# Patient Record
Sex: Female | Born: 1975 | Race: White | Hispanic: No | Marital: Single | Smoking: Never smoker
Health system: Southern US, Community
[De-identification: ages and names within clinical notes are randomized; demographics above are authoritative.]

## PROBLEM LIST (undated history)

## (undated) DIAGNOSIS — Z8049 Family history of malignant neoplasm of other genital organs: Secondary | ICD-10-CM

## (undated) DIAGNOSIS — M545 Low back pain: Principal | ICD-10-CM

## (undated) DIAGNOSIS — T7840XA Allergy, unspecified, initial encounter: Secondary | ICD-10-CM

## (undated) DIAGNOSIS — Z8041 Family history of malignant neoplasm of ovary: Secondary | ICD-10-CM

## (undated) DIAGNOSIS — Z800 Family history of malignant neoplasm of digestive organs: Secondary | ICD-10-CM

## (undated) DIAGNOSIS — Z801 Family history of malignant neoplasm of trachea, bronchus and lung: Secondary | ICD-10-CM

## (undated) DIAGNOSIS — Z8042 Family history of malignant neoplasm of prostate: Secondary | ICD-10-CM

## (undated) DIAGNOSIS — Z8052 Family history of malignant neoplasm of bladder: Secondary | ICD-10-CM

## (undated) DIAGNOSIS — B009 Herpesviral infection, unspecified: Secondary | ICD-10-CM

## (undated) DIAGNOSIS — G8929 Other chronic pain: Secondary | ICD-10-CM

## (undated) HISTORY — DX: Family history of malignant neoplasm of digestive organs: Z80.0

## (undated) HISTORY — DX: Allergy, unspecified, initial encounter: T78.40XA

## (undated) HISTORY — DX: Family history of malignant neoplasm of other genital organs: Z80.49

## (undated) HISTORY — DX: Other chronic pain: G89.29

## (undated) HISTORY — DX: Family history of malignant neoplasm of ovary: Z80.41

## (undated) HISTORY — PX: TONSILLECTOMY: SUR1361

## (undated) HISTORY — DX: Family history of malignant neoplasm of bladder: Z80.52

## (undated) HISTORY — DX: Low back pain: M54.5

## (undated) HISTORY — PX: BUNIONECTOMY: SHX129

## (undated) HISTORY — DX: Family history of malignant neoplasm of trachea, bronchus and lung: Z80.1

## (undated) HISTORY — DX: Herpesviral infection, unspecified: B00.9

## (undated) HISTORY — DX: Family history of malignant neoplasm of prostate: Z80.42

---

## 2016-11-23 DIAGNOSIS — R7301 Impaired fasting glucose: Secondary | ICD-10-CM | POA: Insufficient documentation

## 2017-08-08 ENCOUNTER — Other Ambulatory Visit: Payer: Self-pay

## 2017-08-08 ENCOUNTER — Emergency Department (INDEPENDENT_AMBULATORY_CARE_PROVIDER_SITE_OTHER)
Admission: EM | Admit: 2017-08-08 | Discharge: 2017-08-08 | Disposition: A | Discharge status: Home / Self Care | Payer: Managed Care, Other (non HMO) | Source: Home / Self Care | Attending: Emergency Medicine | Admitting: Emergency Medicine

## 2017-08-08 ENCOUNTER — Encounter: Payer: Self-pay | Admitting: Emergency Medicine

## 2017-08-08 DIAGNOSIS — W57XXXA Bitten or stung by nonvenomous insect and other nonvenomous arthropods, initial encounter: Secondary | ICD-10-CM

## 2017-08-08 DIAGNOSIS — L2389 Allergic contact dermatitis due to other agents: Secondary | ICD-10-CM

## 2017-08-08 MED ORDER — BETAMETHASONE DIPROPIONATE 0.05 % EX CREA: TOPICAL_CREAM | Freq: Two times a day (BID) | CUTANEOUS | 0 refills | Status: DC

## 2017-08-08 MED ORDER — PREDNISONE 20 MG PO TABS: 20.0000 mg | ORAL_TABLET | Freq: Two times a day (BID) | ORAL | 0 refills | Status: DC

## 2017-08-08 MED ORDER — HYDROXYZINE HCL 10 MG PO TABS: ORAL_TABLET | ORAL | 0 refills | Status: DC

## 2017-08-08 NOTE — ED Triage Notes (Signed)
Pt c/o bilateral leg redness and rash.

## 2017-08-09 NOTE — ED Provider Notes (Signed)
[facility]    CSN: [id number] Arrival date & time: 08/08/17  1708     History   Chief Complaint Chief Complaint  Patient presents with  . Rash    HPI Morgan Lucas is a 42 y.o. female.   HPI Complains of severe worsening pruritic rash from bug bites on both legs, for about 4 to 5 days. Has been trying over-the-counter hydrocortisone cream without help.  Oral Benadryl not helping. Denies fever or chills or nausea or vomiting. No cardiorespiratory symptoms.  No ENT symptoms.  No dysphasia or hoarseness.  No breathing problems or wheezing. History reviewed. No pertinent past medical history. Past medical history negative for chronic disease.  Denies history of diabetes.  She denies chance of pregnancy. There are no active problems to display for this patient.   Past Surgical History:  Procedure Laterality Date  . BUNIONECTOMY    . TONSILLECTOMY      OB History   None      Home Medications    Prior to Admission medications   Medication Sig Start Date End Date Taking? Authorizing Provider  acyclovir (ZOVIRAX) 400 MG tablet Take 400 mg by mouth 5 (five) times daily.   Yes [provider]  levonorgestrel-ethinyl estradiol (AVIANE,ALESSE,LESSINA) 0.1-20 MG-MCG tablet Take 1 tablet by mouth daily.   Yes [provider]  Lysine 500 MG CAPS Take by mouth.   Yes [provider]  betamethasone dipropionate (DIPROLENE) 0.05 % cream Apply topically 2 (two) times daily. 08/08/17   Lajean ManesMassey, David, MD  hydrOXYzine (ATARAX/VISTARIL) 10 MG tablet 1 or 2 tabs every 6 hours as needed for itch . caution: May cause drowsiness 08/08/17   Lajean ManesMassey, David, MD  predniSONE (DELTASONE) 20 MG tablet Take 1 tablet (20 mg total) by mouth 2 (two) times daily with a meal. X 5 days 08/08/17   Lajean ManesMassey, David, MD    Family History Family History  Problem Relation Age of Onset  . Hypertension Mother   . Hyperlipidemia Mother   . Cancer Father   . Diabetes Father     . Hypertension Father   . Hyperlipidemia Father     Social History Social History   Tobacco Use  . Smoking status: Never Smoker  . Smokeless tobacco: Never Used  Substance Use Topics  . Alcohol use: Yes  . Drug use: Not Currently  Does not smoke or use drugs.  Only drinks alcohol rarely.   Allergies   Codeine; Nickel; Pistachio nut (diagnostic); and Silver   Review of Systems Review of Systems  All other systems reviewed and are negative.  Pertinent items noted in HPI and remainder of comprehensive ROS otherwise negative.  Physical Exam Triage Vital Signs ED Triage Vitals  Enc Vitals Group     BP 08/08/17 1726 127/76     Pulse Rate 08/08/17 1726 87     Resp --      Temp 08/08/17 1726 98.4 F (36.9 C)     Temp Source 08/08/17 1726 Oral     SpO2 08/08/17 1726 99 %     Weight 08/08/17 1727 174 lb (78.9 kg)     Height 08/08/17 1727 5\' 3"  (1.6 m)     Head Circumference --      Peak Flow --      Pain Score 08/08/17 1727 0     Pain Loc --      Pain Edu? --      Excl. in GC? --    No data  found.  Updated Vital Signs BP 127/76 (BP Location: Left Arm)   Pulse 87   Temp 98.4 F (36.9 C) (Oral)   Ht 5\' 3"  (1.6 m)   Wt 174 lb (78.9 kg)   LMP 07/22/2017 (Approximate)   SpO2 99%   BMI 30.82 kg/m   Visual Acuity Right Eye Distance:   Left Eye Distance:   Bilateral Distance:    Right Eye Near:   Left Eye Near:    Bilateral Near:     Physical Exam  Constitutional: She is oriented to person, place, and time. She appears well-developed and well-nourished. No distress.  No cardiorespiratory distress, but she is extremely uncomfortable from itchy rash both legs.  HENT:  Head: Normocephalic and atraumatic.  Mouth/Throat: Oropharynx is clear and moist and mucous membranes are normal. No posterior oropharyngeal edema or posterior oropharyngeal erythema.  Eyes: Pupils are equal, round, and reactive to light. No scleral icterus.  Neck: Normal range of motion. Neck  supple.  Cardiovascular: Normal rate and regular rhythm.  Pulmonary/Chest: Effort normal and breath sounds normal. She has no wheezes.  Abdominal: She exhibits no distension.  Neurological: She is alert and oriented to person, place, and time. No cranial nerve deficit.  Skin: Skin is warm and dry. Capillary refill takes less than 2 seconds. Rash noted.  Psychiatric: She has a normal mood and affect. Her behavior is normal.  Vitals reviewed. Both lower legs diffusely have multiple papules that are red, inflamed and raised.  No pustules or vesicles.  No annular lesions.  No sign of secondary infection.  No track marks. There is no rash on the feet or ankles or hands or wrists or trunk. Feet: Capillary refill and DP pulse and neurovascular distally intact.  UC Treatments / Results  Labs (all labs ordered are listed, but only abnormal results are displayed) Labs Reviewed - No data to display  EKG None  Radiology No results found.  Procedures Procedures (including critical care time)  Medications Ordered in UC Medications - No data to display  Initial Impression / Assessment and Plan / UC Course  I have reviewed the triage vital signs and the nursing notes.  Pertinent labs & imaging results that were available during my care of the patient were reviewed by me and considered in my medical decision making (see chart for details).      Final Clinical Impressions(s) / UC Diagnoses   Final diagnoses:  Allergic contact dermatitis due to other agents  Multiple insect bites   Clinically, diagnosis is severe multiple insect bites, bilateral lower legs.  Rash is not typical of scabies.  No evidence of secondary bacterial infection. Treatment options discussed, as well as risks, benefits, alternatives. Patient voiced understanding and agreement with the following plans: Symptomatic nonpharmacologic measures discussed. Some written information given.  Questions invited and  answered.  ED Prescriptions    Medication Sig Dispense Auth. Provider   hydrOXYzine (ATARAX/VISTARIL) 10 MG tablet 1 or 2 tabs every 6 hours as needed for itch . caution: May cause drowsiness 20 tablet Lajean Manes, MD   betamethasone dipropionate (DIPROLENE) 0.05 % cream Apply topically 2 (two) times daily. 45 g Lajean Manes, MD   predniSONE (DELTASONE) 20 MG tablet Take 1 tablet (20 mg total) by mouth 2 (two) times daily with a meal. X 5 days 10 tablet Lajean Manes, MD     Follow-up with your primary care doctor or dermatologist in 5-7 days if not improving, or sooner if symptoms become worse. Precautions  discussed. Red flags discussed. Questions invited and answered. Patient voiced understanding and agreement.  Controlled Substance Prescriptions [place] Controlled Substance Registry consulted? Not Applicable   Lajean Manes, MD 08/09/17 2244

## 2017-08-30 ENCOUNTER — Ambulatory Visit (INDEPENDENT_AMBULATORY_CARE_PROVIDER_SITE_OTHER): Payer: Managed Care, Other (non HMO) | Admitting: Osteopathic Medicine

## 2017-08-30 ENCOUNTER — Encounter: Payer: Self-pay | Admitting: Osteopathic Medicine

## 2017-08-30 VITALS — BP 121/69 | HR 66 | Temp 98.5°F | Wt 176.2 lb

## 2017-08-30 DIAGNOSIS — M545 Low back pain, unspecified: Secondary | ICD-10-CM | POA: Insufficient documentation

## 2017-08-30 DIAGNOSIS — Z8041 Family history of malignant neoplasm of ovary: Secondary | ICD-10-CM

## 2017-08-30 DIAGNOSIS — G8929 Other chronic pain: Secondary | ICD-10-CM

## 2017-08-30 DIAGNOSIS — B009 Herpesviral infection, unspecified: Secondary | ICD-10-CM | POA: Diagnosis not present

## 2017-08-30 DIAGNOSIS — Z23 Encounter for immunization: Secondary | ICD-10-CM | POA: Diagnosis not present

## 2017-08-30 DIAGNOSIS — R21 Rash and other nonspecific skin eruption: Secondary | ICD-10-CM | POA: Insufficient documentation

## 2017-08-30 HISTORY — DX: Herpesviral infection, unspecified: B00.9

## 2017-08-30 HISTORY — DX: Family history of malignant neoplasm of ovary: Z80.41

## 2017-08-30 HISTORY — DX: Low back pain, unspecified: M54.50

## 2017-08-30 HISTORY — DX: Other chronic pain: G89.29
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
## 2017-09-10 ENCOUNTER — Ambulatory Visit: Payer: Managed Care, Other (non HMO) | Admitting: Sports Medicine

## 2017-09-11 ENCOUNTER — Encounter: Payer: Self-pay | Admitting: Emergency Medicine

## 2017-09-11 ENCOUNTER — Emergency Department (INDEPENDENT_AMBULATORY_CARE_PROVIDER_SITE_OTHER)
Admission: EM | Admit: 2017-09-11 | Discharge: 2017-09-11 | Disposition: A | Discharge status: Home / Self Care | Payer: Managed Care, Other (non HMO) | Source: Home / Self Care | Attending: Family Medicine | Admitting: Family Medicine

## 2017-09-11 ENCOUNTER — Other Ambulatory Visit: Payer: Self-pay

## 2017-09-11 DIAGNOSIS — M545 Low back pain, unspecified: Secondary | ICD-10-CM

## 2017-09-11 MED ORDER — PREDNISONE 20 MG PO TABS: ORAL_TABLET | ORAL | 0 refills | Status: DC

## 2017-09-11 MED ORDER — CYCLOBENZAPRINE HCL 5 MG PO TABS: 5.0000 mg | ORAL_TABLET | Freq: Two times a day (BID) | ORAL | 0 refills | Status: AC | PRN

## 2017-09-11 NOTE — Discharge Instructions (Signed)
°  Flexeril (cyclobenzaprine) is a muscle relaxer and may cause drowsiness. Do not drink alcohol, drive, or operate heavy machinery while taking.  Please follow up with your medical provider on Tuesday as previously scheduled for continued management of your lower back pain.

## 2017-09-11 NOTE — ED Triage Notes (Signed)
Pt c/o of back pain. States she has chronic issues with back and was given back stregthening exercises to perform and while doing those yesterday morning she felt her back give out. States she had an appointment with a DO but they were unable to adjust her until Tuesday.

## 2017-09-11 NOTE — ED Provider Notes (Signed)
[facility]    CSN: [id number] Arrival date & time: 09/11/17  1243     History   Chief Complaint Chief Complaint  Patient presents with  . Back Pain    HPI Morgan Lucas is a 42 y.o. female.   HPI  Morgan Lucas is a 42 y.o. female presenting to UC with c/o exacerbation of chronic low back pain after doing some stretching exercises at home yesterday. She felt a "pop" in her back and her back "gave out" she has had muscle spasms and sharp pain since then. Pain is moderate but severe at times. Pain gradually improves with slow movement but is most severe when she initially gets out of bed or out of a chair. Denies numbness or weakness in arms or legs. No change in bowel or bladder habits. Pt notes she usually has to get her back "adjusted" every few months. She has f/u with her new PCP on Tuesday but did not feel like she could wait until then. She has done well with pain medication and muscle relaxers before.    Past Medical History:  Diagnosis Date  . Chronic bilateral low back pain without sciatica 08/30/2017  . Family history of ovarian cancer 08/30/2017  . 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 Rash and nonspecific skin eruption 08/30/2017  . 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 Family history of ovarian cancer 08/30/2017  . Chronic bilateral low back pain without sciatica 08/30/2017    Past Surgical History:  Procedure Laterality Date  . BUNIONECTOMY    . TONSILLECTOMY      OB History   None      Home Medications    Prior to Admission medications   Medication Sig Start Date End Date Taking? Authorizing Provider  acyclovir (ZOVIRAX) 400 MG tablet Take 400 mg by mouth 5 (five) times daily.    [provider]  cetirizine (ZYRTEC) 10 MG tablet Take 10 mg by mouth daily.    [provider]  cyclobenzaprine (FLEXERIL) 5 MG tablet Take 1-2 tablets (5-10 mg total) by mouth 2 (two) times daily as needed  for muscle spasms. 09/11/17   Lurene Shadow, PA-C  EPINEPHrine 0.15 MG/0.15ML IJ injection Inject 0.15 mg into the muscle as needed for anaphylaxis.    [provider]  levonorgestrel-ethinyl estradiol (AVIANE,ALESSE,LESSINA) 0.1-20 MG-MCG tablet Take 1 tablet by mouth daily.    [provider]  Lysine 500 MG CAPS Take by mouth.    [provider]  Multiple Vitamins-Calcium (ONE-A-DAY WOMENS PO) Take by mouth.    [provider]  predniSONE (DELTASONE) 20 MG tablet 3 tabs po day one, then 2 po daily x 4 days 09/11/17   Lurene Shadow, PA-C  triamcinolone cream (KENALOG) 0.1 % Apply 1 application topically 2 (two) times daily.    [provider]    Family History Family History  Problem Relation Age of Onset  . Hypertension Mother   . Hyperlipidemia Mother   . Cancer Father   . Diabetes Father   . Hypertension Father   . Hyperlipidemia Father     Social History Social History   Tobacco Use  . Smoking status: Never Smoker  . Smokeless tobacco: Never Used  Substance Use Topics  . Alcohol use: Yes  . Drug use: Not Currently     Allergies   Cashew nut oil; Codeine; Nickel; Pistachio nut (diagnostic); and Silver   Review of Systems  Review of Systems  Genitourinary: Negative for dysuria, frequency and hematuria.  Musculoskeletal: Positive for back pain and myalgias. Negative for neck pain and neck stiffness.  Skin: Negative for color change and rash.  Neurological: Negative for weakness and numbness.     Physical Exam Triage Vital Signs ED Triage Vitals [09/11/17 1302]  Enc Vitals Group     BP 106/63     Pulse Rate 80     Resp      Temp 98.1 F (36.7 C)     Temp Source Oral     SpO2 100 %     Weight      Height      Head Circumference      Peak Flow      Pain Score 7     Pain Loc      Pain Edu?      Excl. in GC?    No data found.  Updated Vital Signs BP 106/63 (BP Location: Left Arm)   Pulse 80   Temp 98.1 F  (36.7 C) (Oral)   LMP 08/24/2017   SpO2 100%   Visual Acuity Right Eye Distance:   Left Eye Distance:   Bilateral Distance:    Right Eye Near:   Left Eye Near:    Bilateral Near:     Physical Exam  Constitutional: She is oriented to person, place, and time. She appears well-developed and well-nourished. No distress.  HENT:  Head: Normocephalic and atraumatic.  Eyes: EOM are normal.  Neck: Normal range of motion.  Cardiovascular: Normal rate.  Pulmonary/Chest: Effort normal. No respiratory distress.  Musculoskeletal: Normal range of motion. She exhibits tenderness. She exhibits no edema.  Tenderness to lower lumbar spine and SI joint. Tenderness to bilateral lumbar muscles.  Negative straight leg raise. Antalgic gait, gradually improves after pt takes a few steps  Neurological: She is alert and oriented to person, place, and time.  Skin: Skin is warm and dry. She is not diaphoretic.  Psychiatric: She has a normal mood and affect. Her behavior is normal.  Nursing note and vitals reviewed.    UC Treatments / Results  Labs (all labs ordered are listed, but only abnormal results are displayed) Labs Reviewed - No data to display  EKG None  Radiology No results found.  Procedures Procedures (including critical care time)  Medications Ordered in UC Medications - No data to display  Initial Impression / Assessment and Plan / UC Course  I have reviewed the triage vital signs and the nursing notes.  Pertinent labs & imaging results that were available during my care of the patient were reviewed by me and considered in my medical decision making (see chart for details).     Hx and exam c/w exacerbation of chronic low back pain. No red flag symptoms. Will tx symptomatically at this time. Pt does have f/u scheduled with her PCP for Tuesday, 09/14/17. Home instructions provided.   Final Clinical Impressions(s) / UC Diagnoses   Final diagnoses:  Acute bilateral low  back pain without sciatica     Discharge Instructions      Flexeril (cyclobenzaprine) is a muscle relaxer and may cause drowsiness. Do not drink alcohol, drive, or operate heavy machinery while taking.  Please follow up with your medical provider on Tuesday as previously scheduled for continued management of your lower back pain.     ED Prescriptions    Medication Sig Dispense Auth. Provider   cyclobenzaprine (FLEXERIL) 5 MG tablet Take 1-2  tablets (5-10 mg total) by mouth 2 (two) times daily as needed for muscle spasms. 30 tablet Doroteo Glassman, Add Din[place] O, PA-C   predniSONE (DELTASONE) 20 MG tablet 3 tabs po day one, then 2 po daily x 4 days 11 tablet Lurene Shadow, PA-C     Controlled Substance Prescriptions [place] Controlled Substance Registry consulted? Not Applicable   Rolla Plate 09/12/17 1116

## 2017-09-14 ENCOUNTER — Ambulatory Visit (INDEPENDENT_AMBULATORY_CARE_PROVIDER_SITE_OTHER): Payer: Managed Care, Other (non HMO) | Admitting: Osteopathic Medicine

## 2017-09-14 ENCOUNTER — Encounter: Payer: Self-pay | Admitting: Osteopathic Medicine

## 2017-09-14 VITALS — BP 108/66 | HR 67 | Temp 98.1°F | Wt 178.7 lb

## 2017-09-14 DIAGNOSIS — G8929 Other chronic pain: Secondary | ICD-10-CM | POA: Diagnosis not present

## 2017-09-14 DIAGNOSIS — M545 Low back pain: Secondary | ICD-10-CM | POA: Diagnosis not present

## 2017-09-14 DIAGNOSIS — M533 Sacrococcygeal disorders, not elsewhere classified: Secondary | ICD-10-CM | POA: Diagnosis not present

## 2017-09-14 NOTE — Patient Instructions (Signed)
Plan:  Continue Flexeril as needed, finish steroid burst  Will refer to physical therapy   Consider sports medicine follow-up (I would recommend follow-up with one of our sports medicine specialists, Dr Denyse Amass or Dr. Cherylann Parr Dr. Karie Schwalbe, for further evaluation if needed. Just call our office and ask for an appointment for sports medicine!)

## 2017-09-14 NOTE — Progress Notes (Signed)
HPI: Morgan Lucas is a 42 y.o. 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  she presents to [facility] today, 09/14/17,  for chief complaint of:  Back pain  . She was doing home exercises 3 days ago for back pain, ironically, when she felt a pop in her lower back and felt like sacrum is out of whack. Saw urgent care 2 days ago - flexeril and prednisone burst rx'ed     Past medical history, surgical history, and family history reviewed.  Current medication list and allergy/intolerance information reviewed.   (See remainder of HPI, ROS, Phys Exam below)      ASSESSMENT/PLAN:   Sacroiliac joint dysfunction of right side - OMT attempted with little relief - HVLA, sacral rocking amneuvers, pelvic/hip muscle energy maneuvers. Consider sports referral for SI injection? - Plan: Ambulatory referral to Physical Therapy  Chronic bilateral low back pain without sciatica - Plan: Ambulatory referral to Physical Therapy    Patient Instructions  Plan:  Continue Flexeril as needed, finish steroid burst  Will refer to physical therapy   Consider sports medicine follow-up (I would recommend follow-up with one of our sports medicine specialists, Dr Denyse Amass or Dr. Cherylann Parr Dr. Karie Schwalbe, for further evaluation if needed. Just call our office and ask for an appointment for sports medicine!)    Follow-up plan: Return if symptoms worsen or fail to improve.               ############################################ ############################################ ############################################ ############################################    Outpatient Encounter Medications as of 09/14/2017  Medication Sig  . acyclovir (ZOVIRAX) 400 MG tablet Take 400 mg by mouth 5 (five) times daily.  . cetirizine  (ZYRTEC) 10 MG tablet Take 10 mg by mouth daily.  . cyclobenzaprine (FLEXERIL) 5 MG tablet Take 1-2 tablets (5-10 mg total) by mouth 2 (two) times daily as needed for muscle spasms.  Marland Kitchen EPINEPHrine 0.15 MG/0.15ML IJ injection Inject 0.15 mg into the muscle as needed for anaphylaxis.  Marland Kitchen levonorgestrel-ethinyl estradiol (AVIANE,ALESSE,LESSINA) 0.1-20 MG-MCG tablet Take 1 tablet by mouth daily.  Marland Kitchen Lysine 500 MG CAPS Take by mouth.  . Multiple Vitamins-Calcium (ONE-A-DAY WOMENS PO) Take by mouth.  . predniSONE (DELTASONE) 20 MG tablet 3 tabs po day one, then 2 po daily x 4 days  . triamcinolone cream (KENALOG) 0.1 % Apply 1 application topically 2 (two) times daily.   No facility-administered encounter medications on file as of 09/14/2017.    Allergies  Allergen Reactions  . Cashew Nut Oil Anaphylaxis    As per pt  . Codeine   . Nickel   . Pistachio Nut (Diagnostic)     Has Epi pen  . Silver       Review of Systems:  Constitutional: No recent illness  Gastrointestinal: No  abdominal pain  Musculoskeletal: +new myalgia/arthralgia  Skin: No  Rash  Neurologic: No  weakness  Exam:  BP 108/66 (BP Location: Left Arm, Patient Position: Sitting, Cuff Size: Normal)   Pulse 67   Temp 98.1 F (36.7 C) (Oral)   Wt 178 lb 11.2 oz (81.1 kg)   LMP 08/24/2017   BMI 31.66 kg/m   Constitutional: VS see above. General Appearance: alert, well-developed, well-nourished, NAD  Eyes: Normal lids and conjunctive, non-icteric sclera  Ears, Nose, Mouth, Throat: MMM, Normal external inspection ears/nares/mouth/lips/gums.  Neck: No masses, trachea midline.   Respiratory:  Normal respiratory effort.  Musculoskeletal: Gait normal. Symmetric and independent movement of all extremities. +tenderness and restricted mobility R SI joint and R quadratus lumborum area   Neurological: Normal balance/coordination. No tremor.  Skin: warm, dry, intact.   Psychiatric: Normal judgment/insight. Normal mood  and affect. Oriented x3.   Visit summary with medication list and pertinent instructions was printed for patient to review, advised to alert [place] if any changes needed. All questions at time of visit were answered - patient instructed to contact office with any additional concerns. ER/RTC precautions were reviewed with the patient and understanding verbalized.   Follow-up plan: Return if symptoms worsen or fail to improve.    Please note: voice recognition software was used to produce this document, and typos may escape review. Please contact Dr. Lyn Hollingshead for any needed clarifications.

## 2017-09-15 ENCOUNTER — Encounter: Payer: Self-pay | Admitting: Family Medicine

## 2017-09-15 ENCOUNTER — Ambulatory Visit (INDEPENDENT_AMBULATORY_CARE_PROVIDER_SITE_OTHER): Payer: Managed Care, Other (non HMO) | Admitting: Family Medicine

## 2017-09-15 VITALS — BP 110/72 | Ht 61.5 in

## 2017-09-15 DIAGNOSIS — M533 Sacrococcygeal disorders, not elsewhere classified: Secondary | ICD-10-CM | POA: Diagnosis not present

## 2017-09-15 NOTE — Patient Instructions (Addendum)
Continue your HEP. You may need to stay on this for long term.  Heat (pad or rice pillow in microwave) over affected area, 10-15 minutes twice daily.   Ice/cold pack over area for 10-15 min twice daily.  You may be sore over the next 48 hours.  Let us know if you need anything.

## 2017-09-15 NOTE — Progress Notes (Signed)
Pt. has extensive hx of going to DO and chiropracters for low back pain. Pt. stated she has had SI joint issues in the past. Pt. heard and felt pop at R sacral SI joint during workout on Friday. Pt. states she is open to doing PT again, but not at this time d/t insurance issue. Pt. refuses cortisone injections at this time. Pt. is hoping Dr. Carmelia Roller can manipulate her joint like she has had done in the past, without "cracking her back".

## 2017-09-15 NOTE — Progress Notes (Signed)
Chief Complaint  Patient presents with  . Establish Care    low back pain       New Patient Visit SUBJECTIVE: HPI: Morgan Lucas is an 42 y.o.female who is being seen for establishing care.  The patient was previously seen at Dr. Redgie Grayer office.  Was doing HEP and felt a pop in her tailbone area. This is a recurrent issue. Has been using heat. No bowel/bladder incontinence, no neurologic s/s's.  Uses Flexeril, on pred burst, heat. Thinks it may be improving. Has had good success with OMT in past. Prefers not to be cracked (HVLA). Also would like to avoid inj.  Allergies  Allergen Reactions  . Cashew Nut Oil Anaphylaxis    As per pt  . Codeine   . Nickel   . Pistachio Nut (Diagnostic)     Has Epi pen  . Silver     Past Medical History:  Diagnosis Date  . Allergy   . Chronic bilateral low back pain without sciatica 08/30/2017  . Family history of ovarian cancer 08/30/2017  . HSV infection 08/30/2017    Current Outpatient Medications:  .  acyclovir (ZOVIRAX) 400 MG tablet, Take 400 mg by mouth 5 (five) times daily., Disp: , Rfl:  .  cetirizine (ZYRTEC) 10 MG tablet, Take 10 mg by mouth daily., Disp: , Rfl:  .  cyclobenzaprine (FLEXERIL) 5 MG tablet, Take 1-2 tablets (5-10 mg total) by mouth 2 (two) times daily as needed for muscle spasms., Disp: 30 tablet, Rfl: 0 .  EPINEPHrine 0.15 MG/0.15ML IJ injection, Inject 0.15 mg into the muscle as needed for anaphylaxis., Disp: , Rfl:  .  levonorgestrel-ethinyl estradiol (AVIANE,ALESSE,LESSINA) 0.1-20 MG-MCG tablet, Take 1 tablet by mouth daily., Disp: , Rfl:  .  Lysine 500 MG CAPS, Take by mouth., Disp: , Rfl:  .  Multiple Vitamins-Calcium (ONE-A-DAY WOMENS PO), Take by mouth., Disp: , Rfl:  .  predniSONE (DELTASONE) 20 MG tablet, 3 tabs po day one, then 2 po daily x 4 days, Disp: 11 tablet, Rfl: 0 .  triamcinolone cream (KENALOG) 0.1 %, Apply 1 application topically 2 (two) times daily., Disp: , Rfl:   Patient's last menstrual  period was 08/24/2017.  ROS Neuro: Denies weakness  MSK: +tailbone pain   OBJECTIVE: BP 110/72 (BP Location: Right Arm, Patient Position: Sitting, Cuff Size: Normal)   Ht 5' 1.5" (1.562 m)   LMP 08/24/2017   BMI 33.22 kg/m   Constitutional: -  VS reviewed -  Well developed, well nourished, appears stated age -  No apparent distress  Psychiatric: -  Oriented to person, place, and time -  Memory intact -  Affect and mood normal -  Fluent conversation, good eye contact -  Judgment and insight age appropriate  Eye: -  Conjunctivae clear, no discharge -  Pupils symmetric, round, reactive to light  ENMT: -  MMM    Pharynx moist, no exudate, no erythema  Neck: -  No gross swelling, no palpable masses -  Thyroid midline, not enlarged, mobile, no palpable masses  Respiratory: -  No accessory muscle use, no retraction  Neurological:  -  CN II - XII grossly intact -  Sensation grossly intact to light touch, equal bilaterally  Musculoskeletal: -  No clubbing, no cyanosis -  +ttp in lumbar paraspinal msk b/l, worse on R; TTP over b/l SI jts, worse on R -  Neg straight leg  Skin: -  No significant lesion on inspection -  Warm and dry to palpation  PROCEDURE NOTE After discussing the procedure and risks, including but not limited to increased pain or stiffness and rarely nausea or dizziness, verbal consent was obtained.  Pre-procedure diagnosis: Somatic dysfunction Post-procedure diagnosis: Same Procedure: OMT  Regions treated include pubic bones, lumbar spine, sacrum: Soft tissue/MET/functional with the tissue response noted to be Improved.  The patient tolerated the procedure well, and there were no complications noted.  The patient was warned of the possibility of increased pain or stiffness of up to 48 hours duration and was asked to call with any unexpected problems.   ASSESSMENT/PLAN: Sacroiliac joint dysfunction of right side  Patient instructed to sign release of  records form from her previous PCP in [place]. Patient should return at earliest convenience for CPE or prn. The patient voiced understanding and agreement to the plan.  Greater than 30 minutes were spent face to face with the patient with greater than 50% of this time spent counseling on dx, treatment, follow up and prognosis independent of OMM.     [place], DO 09/15/17  3:28 PM

## 2017-09-20 ENCOUNTER — Encounter: Payer: Self-pay | Admitting: Family Medicine

## 2017-09-20 DIAGNOSIS — G8929 Other chronic pain: Secondary | ICD-10-CM

## 2017-09-20 DIAGNOSIS — M545 Low back pain: Principal | ICD-10-CM

## 2017-09-20 DIAGNOSIS — M533 Sacrococcygeal disorders, not elsewhere classified: Secondary | ICD-10-CM

## 2017-09-21 ENCOUNTER — Ambulatory Visit: Payer: Managed Care, Other (non HMO) | Admitting: Family Medicine

## 2017-09-21 ENCOUNTER — Telehealth: Payer: Self-pay | Admitting: Genetics

## 2017-09-21 ENCOUNTER — Encounter: Payer: Self-pay | Admitting: *Deleted

## 2017-09-21 NOTE — Telephone Encounter (Signed)
A genetic counseling appt has been scheduled for the pt to see Darral DashLindsay Smith on 10/10 at 2pm. Pt aware to arrive 15 minutes early. Letter mailed.

## 2017-09-22 ENCOUNTER — Telehealth: Payer: Self-pay | Admitting: *Deleted

## 2017-09-22 NOTE — Telephone Encounter (Signed)
Received Medical records from [facility]; forwarded to provider/SLS 09/18

## 2017-09-24 ENCOUNTER — Encounter: Payer: Self-pay | Admitting: Family Medicine

## 2017-10-11 ENCOUNTER — Ambulatory Visit: Payer: Managed Care, Other (non HMO) | Admitting: Family Medicine

## 2017-10-12 NOTE — Progress Notes (Signed)
[facility] [address] [place], [place] [address] Phone: [phone] Subjective:    I Morgan Lucas am serving as a Neurosurgeon for Dr. Antoine Primas.   I'm seeing this patient by the request  of:  Sharlene Dory, DO   CC: low back pain   [id number]  Morgan Lucas is a 42 y.o. female coming in with complaint of back pain. Injured her back during back strengthening exercises. Back is doing better but she feels something is wrong. Hears the right hip pop. Here for manipulations. Says that her Sacrum comes out of alignment. Has been to PT. patient moved here 3 months ago.  Patient was having manipulation done in [place].  Never has though been without pain for quite some time.  Onset- Chronic Location- Lower right side  Character- tight  Aggravating factors- Walking  Severity-7 out of 10     Past Medical History:  Diagnosis Date  . Allergy   . Chronic bilateral low back pain without sciatica 08/30/2017  . Family history of bladder cancer   . Family history of colon cancer   . Family history of lung cancer   . Family history of ovarian cancer 08/30/2017  . Family history of prostate cancer   . Family history of uterine cancer   . 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 BUNIONECTOMY    . TONSILLECTOMY     Social History   Socioeconomic History  . Marital status: Single    Spouse name: Not on file  . Number of children: Not on file  . Years of education: Not on file  . Highest education level: Not on file  Occupational History  . Occupation: Special educational needs teacher: [employer]  Social Needs  . Financial resource strain: Not hard at all  . Food insecurity:    Worry: Never true    Inability: Never true  . Transportation needs:    Medical: Patient refused    Non-medical: Patient refused  Tobacco Use  . Smoking status: Never Smoker  . Smokeless tobacco: Never Used  Substance and  Sexual Activity  . Alcohol use: Yes    Alcohol/week: 4.0 standard drinks    Types: 4 Glasses of wine per week  . Drug use: Not Currently  . Sexual activity: Not Currently  Lifestyle  . Physical activity:    Days per week: 3 days    Minutes per session: 20 min  . Stress: Only a little  Relationships  . Social connections:    Talks on phone: Three times a week    Gets together: Once a week    Attends religious service: More than 4 times per year    Active member of club or organization: No    Attends meetings of clubs or organizations: Never    Relationship status: Never married  Other Topics Concern  . Not on file  Social History Narrative  . Not on file   Allergies  Allergen Reactions  . Cashew Nut Oil Anaphylaxis    As per pt  . Codeine   . Nickel   . Pistachio Nut (Diagnostic)     Has Epi pen  . Silver    Family History  Problem Relation Age of Onset  . Hypertension Mother   . Hyperlipidemia Mother   . Uterine cancer Mother        dx over the age of 20  . Diabetes Father   .  Hyperlipidemia Father   . Prostate cancer Father        'slow growing'  . Lung cancer Father   . Bladder Cancer Father   . Leukemia Father        CLL  . 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 Hypertension Sister   . Anxiety disorder Sister   . Heart disease Maternal Grandmother   . Cancer Maternal Grandmother        blood cancer  . Parkinson's disease Maternal Grandfather   . Angina Maternal Grandfather   . Dementia Maternal Grandfather   . Colon cancer Maternal Grandfather   . Anxiety disorder Sister   . Depression Sister   . Aneurysm Paternal Grandmother        'young'    Current Outpatient Medications (Endocrine & Metabolic):  .  levonorgestrel-ethinyl estradiol (AVIANE,ALESSE,LESSINA) 0.1-20 MG-MCG tablet, Take 1 tablet by mouth daily. .  predniSONE (DELTASONE) 20 MG tablet, 3 tabs po day one, then 2 po daily x 4 days  Current Outpatient  Medications (Cardiovascular):  Marland Kitchen  EPINEPHrine 0.15 MG/0.15ML IJ injection, Inject 0.15 mg into the muscle as needed for anaphylaxis.  Current Outpatient Medications (Respiratory):  .  cetirizine (ZYRTEC) 10 MG tablet, Take 10 mg by mouth daily.    Current Outpatient Medications (Other):  .  acyclovir (ZOVIRAX) 400 MG tablet, Take 400 mg by mouth 5 (five) times daily. .  cyclobenzaprine (FLEXERIL) 5 MG tablet, Take 1-2 tablets (5-10 mg total) by mouth 2 (two) times daily as needed for muscle spasms. Marland Kitchen  Lysine 500 MG CAPS, Take by mouth. .  Multiple Vitamins-Calcium (ONE-A-DAY WOMENS PO), Take by mouth. .  triamcinolone cream (KENALOG) 0.1 %, Apply 1 application topically 2 (two) times daily. .  Diclofenac Sodium (PENNSAID) 2 % SOLN, Place 2 g onto the skin 2 (two) times daily.    Past medical history, social, surgical and family history all reviewed in electronic medical record.  No pertanent information unless stated regarding to the chief complaint.   Review of Systems:  No headache, visual changes, nausea, vomiting, diarrhea, constipation, dizziness, abdominal pain, skin rash, fevers, chills, night sweats, weight loss, swollen lymph nodes, body aches, joint swelling,  chest pain, shortness of breath, mood changes.  Positive muscle aches  Objective  Blood pressure 130/70, pulse 90, height 5' (1.524 m), weight 180 lb (81.6 kg), last menstrual period 10/15/2017, SpO2 98 %.    General: No apparent distress alert and oriented x3 mood and affect normal, dressed appropriately.  HEENT: Pupils equal, extraocular movements intact  Respiratory: Patient's speak in full sentences and does not appear short of breath  Cardiovascular: No lower extremity edema, non tender, no erythema  Skin: Warm dry intact with no signs of infection or rash on extremities or on axial skeleton.  Abdomen: Soft nontender  Neuro: Cranial nerves II through XII are intact, neurovascularly intact in all extremities with  2+ DTRs and 2+ pulses.  Lymph: No lymphadenopathy of posterior or anterior cervical chain or axillae bilaterally.  Gait antalgic MSK:  Non tender with full range of motion and good stability and symmetric strength and tone of shoulders, elbows, wrist, hip, knee and ankles bilaterally.  Back Exam:  Inspection: Loss of lordosis with mild scoliosis. Motion: Flexion 45 deg, Extension 25 deg, Side Bending to 35 deg bilaterally,  Rotation to 45 deg bilaterally  SLR laying: Negative  XSLR laying: Negative  Palpable tenderness: Tender  over the right sacroiliac joint. FABER: Positive right. Sensory change: Gross sensation intact to all lumbar and sacral dermatomes.  Reflexes: 2+ at both patellar tendons, 2+ at achilles tendons, Babinski's downgoing.  Strength at foot  Plantar-flexion: 5/5 Dorsi-flexion: 5/5 Eversion: 5/5 Inversion: 5/5  Leg strength  Quad: 5/5 Hamstring: 5/5 Hip flexor: 5/5 Hip abductors: 5/5    Osteopathic findings  T3 extended rotated and side bent right inhaled third rib T9 extended rotated and side bent left L3 flexed rotated and side bent right Sacrum right on right Pelvic shear noted on the right    Impression and Recommendations:     This case required medical decision making of moderate complexity. The above documentation has been reviewed and is accurate and complete Judi Saa, DO       Note: This dictation was prepared with Dragon dictation along with smaller phrase technology. Any transcriptional errors that result from this process are unintentional.

## 2017-10-14 ENCOUNTER — Inpatient Hospital Stay: Payer: Managed Care, Other (non HMO)

## 2017-10-14 ENCOUNTER — Inpatient Hospital Stay: Payer: Managed Care, Other (non HMO) | Attending: Genetic Counselor | Admitting: Genetics

## 2017-10-14 ENCOUNTER — Encounter: Payer: Self-pay | Admitting: Genetics

## 2017-10-14 DIAGNOSIS — Z8052 Family history of malignant neoplasm of bladder: Secondary | ICD-10-CM | POA: Diagnosis not present

## 2017-10-14 DIAGNOSIS — Z801 Family history of malignant neoplasm of trachea, bronchus and lung: Secondary | ICD-10-CM | POA: Insufficient documentation

## 2017-10-14 DIAGNOSIS — Z8049 Family history of malignant neoplasm of other genital organs: Secondary | ICD-10-CM | POA: Diagnosis not present

## 2017-10-14 DIAGNOSIS — Z8042 Family history of malignant neoplasm of prostate: Secondary | ICD-10-CM

## 2017-10-14 DIAGNOSIS — Z800 Family history of malignant neoplasm of digestive organs: Secondary | ICD-10-CM

## 2017-10-14 NOTE — Progress Notes (Signed)
REFERRING PROVIDER: Emeterio Reeve, DO [place], [place] [address]  PRIMARY PROVIDER:  Shelda Pal, DO  PRIMARY REASON FOR VISIT:  1. Family history of prostate cancer   2. Family history of bladder cancer   3. Family history of uterine cancer   4. Family history of colon cancer   5. Family history of lung cancer     HISTORY OF PRESENT ILLNESS:   Morgan Lucas, a 42 y.o. female, was seen for a [facility] cancer genetics consultation at the request of Dr. Sheppard Coil due to a family history of cancer.  Morgan Lucas presents to clinic today to discuss the possibility of a hereditary predisposition to cancer, genetic testing, and to further clarify her future cancer risks, as well as potential cancer risks for family members.   Morgan Lucas is a 42 y.o. female with no personal history of cancer.    HORMONAL RISK FACTORS:  Menarche was at age 88.  First live birth at age N/A.  Ovaries intact: yes.  Hysterectomy: no.  Menopausal status: premenopausal.  Colonoscopy: no; not examined. Mammogram within the last year: no. Number of breast biopsies: 0.  Past Medical History:  Diagnosis Date  . Allergy   . Chronic bilateral low back pain without sciatica 08/30/2017  . Family history of bladder cancer   . Family history of colon cancer   . Family history of lung cancer   . Family history of ovarian cancer 08/30/2017  . Family history of prostate cancer   . Family history of uterine cancer   . 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 BUNIONECTOMY    . TONSILLECTOMY      Social History   Socioeconomic History  . Marital status: Single    Spouse name: Not on file  . Number of children: Not on file  . Years of education: Not on file  . Highest education level: Not on file  Occupational History  . Occupation: Surveyor, quantity: [employer]  . Financial resource strain: Not hard at all  .  Food insecurity:    Worry: Never true    Inability: Never true  . Transportation needs:    Medical: Patient refused    Non-medical: Patient refused  Tobacco Use  . Smoking status: Never Smoker  . Smokeless tobacco: Never Used  Substance and Sexual Activity  . Alcohol use: Yes    Alcohol/week: 4.0 standard drinks    Types: 4 Glasses of wine per week  . Drug use: Not Currently  . Sexual activity: Not Currently  Lifestyle  . Physical activity:    Days per week: 3 days    Minutes per session: 20 min  . Stress: Only a little  Relationships  . Social connections:    Talks on phone: Three times a week    Gets together: Once a week    Attends religious service: More than 4 times per year    Active member of club or organization: No    Attends meetings of clubs or organizations: Never    Relationship status: Never married  Other Topics Concern  . Not on file  Social History Narrative  . Not on file     FAMILY HISTORY:  We obtained a detailed, 4-generation family history.  Significant diagnoses are listed below: Family History  Problem Relation Age of Onset  . Hypertension Mother   . Hyperlipidemia Mother   .  Uterine cancer Mother        dx over the age of 101  . Diabetes Father   . Hyperlipidemia Father   . Prostate cancer Father        'slow growing'  . Lung cancer Father   . Bladder Cancer Father   . Leukemia Father        CLL  . 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 Hypertension Sister   . Anxiety disorder Sister   . Heart disease Maternal Grandmother   . Cancer Maternal Grandmother        blood cancer  . Parkinson's disease Maternal Grandfather   . Angina Maternal Grandfather   . Dementia Maternal Grandfather   . Colon cancer Maternal Grandfather   . Anxiety disorder Sister   . Depression Sister   . Aneurysm Paternal Grandmother        'young'    Morgan Lucas has no children.  She has 2 sisters ages 46 and 39 with no history of  cancer.  Ms. Vanessa Kick father: dx with prostate cancer I his 66's, he later developed a separate bladder cancer.  Then he was dx with leukemia (CLL) and skin cancers have developed which his doctor says are related to the CLL. He has also had lung cancer.  His prostate cancer is slow growing.  He is now 52. Paternal Aunts/Uncles: none, father was only child Paternal cousins: none Paternal grandfather: died in his 60's with no known cancer Paternal grandmother:died 'young' due to anneurysm  Ms. Shrewsbury's mother: 37, hx of uterine cancer dx older than 63.  Patient says she previously reported ovarian, but now thinks uterine after discussing with her mother.  Her mother had a hysterectomy, no chemo) Maternal Aunts/Uncles: 2 maternal aunts with no history of cancer.  Maternal cousins: no history of cancer.  Maternal grandfather: had colon cancer dx older than 4, he had parkinson's disease.  Maternal grandmother:died in her 43's of a  blood cancer.   Morgan Lucas is unaware of previous family history of genetic testing for hereditary cancer risks. Patient's maternal ancestors are of [place] descent, and paternal ancestors are of [place] descent. There is no reported Ashkenazi Jewish ancestry. There is no known consanguinity.  GENETIC COUNSELING ASSESSMENT: [name] is a 42 y.o. female with a family history of cancer.   We, therefore, discussed and recommended the following at today's visit.   DISCUSSION: We reviewed the characteristics, features and inheritance patterns of hereditary cancer syndromes. We also discussed genetic testing, including the appropriate family members to test, the process of testing, insurance coverage and turn-around-time for results. We discussed the implications of a negative, positive and/or variant of uncertain significant result.   We discussed that only 5-10% of cancers are associated with a Hereditary Cancer Predisposition Syndrome.  The most common  hereditary cancer syndrome associated with colon cancer is Lynch Syndrome.  Lynch Syndrome is caused by mutations in the genes: MLH1, MSH2, MSH6, PMS2 and EPCAM.  This syndrome increases the risk for colon, uterine, ovarian and stomach cancers, as well as others.  Families with Lynch Syndrome tend to have multiple family members with these cancers, typically diagnosed under age 5, and diagnoses in multiple generations.    We also dicussed that there are many genes that cause many different types of cancer risks.    We discussed that if she is found to have a mutation in one of  these genes, it may impact future medical management recommendations such as increased cancer screenings and consideration of risk reducing surgeries.  A positive result could also have implications for the patient's family members.  A Negative result would mean we were unable to identify a hereditary component to her cancer, but does not rule out the possibility of a hereditary basis for her cancer.  There could be mutations that are undetectable by current technology, or in genes not yet tested or identified to increase cancer risk.    We discussed the potential to find a Variant of Uncertain Significance or VUS.  These are variants that have not yet been identified as pathogenic or benign, and it is unknown if this variant is associated with increased cancer risk or if this is a normal finding.  Most VUS's are reclassified to benign or likely benign.   It should not be used to make medical management decisions. With time, we suspect the lab will determine the significance of any VUS's identified if any.   We discussed that some people do not want to undergo genetic testing due to fear of genetic discrimination.  A federal law called the Genetic Information Non-Discrimination Act (GINA) of 2008 helps protect individuals against genetic discrimination based on their genetic test results.  It impacts both health insurance and  employment.  For health insurance, it protects against increased premiums, being kicked off insurance or being forced to take a test in order to be insured.  For employment it protects against hiring, firing and promoting decisions based on genetic test results.  Health status due to a cancer diagnosis is not protected under GINA.  This law does not protect life insurance, disability insurance, or other types of insurance.   We discussed with Ms. Henneman's family history does not meet NCCN criteria for Lynch Syndrome or other cancers.  Because she does not meet NCCN criteria, she has a low likelihood of harboring on of these cancer predisposition mutations.  However, we still offered her the option to pursue this test.  We discussed her father's side of the family his very small and he himself has had several cancers (although none that seem to fit the expected pattern of any hereditary cancer syndrome) so we still feel this test is reasonable for Ms. Miramontes.     PLAN:  Morgan Lucas decided not to have genetic testing today.  She would like to think about the information discussed today first and will contact me if she would like to proceed in the future.   We, therefore, reccommended Ms. Ticer continue to follow the cancer screening guidelines given by her primary healthcare provider.  Based on Ms. Bonini's family history, we recommended her father who has had several cancers, could consider genetic counseling/genetic testing.  Morgan Lucas will let us know if we can be of any assistance in coordinating genetic counseling and/or testing for this family member.   Lastly, we encouraged Ms. Kemmerling to remain in contact with cancer genetics annually so that we can continuously update the family history and inform her of any changes in cancer genetics and testing that may be of benefit for this family.   Ms.  Vanessa Kick questions were answered to her satisfaction today. Our contact information was  provided should additional questions or concerns arise. Thank you for the referral and allowing [place] to share in the care of your patient.   Tana Felts, MS, [facility] Certified Genetic Counselor [email or web address] phone: [phone]  The patient was  seen for a total of 40 minutes in face-to-face genetic counseling.

## 2017-10-15 ENCOUNTER — Other Ambulatory Visit: Payer: Self-pay | Admitting: Family Medicine

## 2017-10-15 ENCOUNTER — Ambulatory Visit (INDEPENDENT_AMBULATORY_CARE_PROVIDER_SITE_OTHER)
Admission: RE | Admit: 2017-10-15 | Discharge: 2017-10-15 | Disposition: A | Discharge status: Home / Self Care | Payer: Managed Care, Other (non HMO) | Source: Ambulatory Visit | Attending: Family Medicine | Admitting: Family Medicine

## 2017-10-15 ENCOUNTER — Encounter: Payer: Self-pay | Admitting: Family Medicine

## 2017-10-15 ENCOUNTER — Ambulatory Visit (INDEPENDENT_AMBULATORY_CARE_PROVIDER_SITE_OTHER): Payer: Managed Care, Other (non HMO) | Admitting: Family Medicine

## 2017-10-15 VITALS — BP 130/70 | HR 90 | Ht 60.0 in | Wt 180.0 lb

## 2017-10-15 DIAGNOSIS — M533 Sacrococcygeal disorders, not elsewhere classified: Secondary | ICD-10-CM

## 2017-10-15 DIAGNOSIS — M999 Biomechanical lesion, unspecified: Secondary | ICD-10-CM | POA: Diagnosis not present

## 2017-10-15 DIAGNOSIS — M545 Low back pain, unspecified: Secondary | ICD-10-CM

## 2017-10-15 DIAGNOSIS — G8929 Other chronic pain: Secondary | ICD-10-CM | POA: Diagnosis not present

## 2017-10-15 MED ORDER — DICLOFENAC SODIUM 2 % TD SOLN: 2.0000 g | Freq: Two times a day (BID) | TRANSDERMAL | 3 refills | Status: DC

## 2017-10-15 NOTE — Assessment & Plan Note (Signed)
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  Responded fairly well to osteopathic manipulation.  Discussed posture, ergonomics, we discussed the importance of hip abductor strengthening and stretching of the hip flexors.  Discussed icing regimen.  Follow-up again in 4 to 8 weeks

## 2017-10-15 NOTE — Assessment & Plan Note (Addendum)
Decision today to treat with OMT was based on Physical Exam  After verbal consent patient was treated with HVLA, ME, FPR techniques in  thoracic, lumbar and sacral pelvic  areas  Patient tolerated the procedure well with improvement in symptoms  Patient given exercises, stretches and lifestyle modifications  See medications in patient instructions if given  Patient will follow up in 4-8 weeks

## 2017-10-15 NOTE — Patient Instructions (Addendum)
Good to meet you  Ice 20 minutes 2 times daily. Usually after activity and before bed. Exercises 3 times a week.  pennsaid pinkie amount topically 2 times daily as needed.  Vitamin D 2000 IU daily  Turmeric 500mg  daily  Tart cherry extract any dose at night Hip abductors will be key  See me again in 4 weeks

## 2017-11-12 ENCOUNTER — Ambulatory Visit: Payer: Managed Care, Other (non HMO) | Admitting: Family Medicine

## 2017-11-29 ENCOUNTER — Encounter: Payer: Self-pay | Admitting: Obstetrics and Gynecology

## 2017-12-17 ENCOUNTER — Encounter: Payer: Self-pay | Admitting: Obstetrics and Gynecology

## 2018-01-18 DIAGNOSIS — H50111 Monocular exotropia, right eye: Secondary | ICD-10-CM | POA: Insufficient documentation

## 2018-01-18 DIAGNOSIS — H5010 Unspecified exotropia: Secondary | ICD-10-CM | POA: Insufficient documentation

## 2018-01-18 DIAGNOSIS — R7303 Prediabetes: Secondary | ICD-10-CM | POA: Insufficient documentation

## 2018-01-18 DIAGNOSIS — Z91018 Allergy to other foods: Secondary | ICD-10-CM | POA: Insufficient documentation

## 2018-01-20 ENCOUNTER — Encounter: Payer: Managed Care, Other (non HMO) | Admitting: Obstetrics and Gynecology

## 2018-03-07 ENCOUNTER — Emergency Department: Payer: Managed Care, Other (non HMO)

## 2018-03-07 ENCOUNTER — Observation Stay: Payer: Managed Care, Other (non HMO) | Admitting: Anesthesiology

## 2018-03-07 ENCOUNTER — Observation Stay
Admission: EM | Admit: 2018-03-07 | Discharge: 2018-03-08 | Disposition: A | Discharge status: Home / Self Care | Payer: Managed Care, Other (non HMO) | Attending: Surgery | Admitting: Surgery

## 2018-03-07 ENCOUNTER — Encounter
Admission: EM | Disposition: A | Discharge status: Home / Self Care | Payer: Self-pay | Source: Home / Self Care | Attending: Emergency Medicine

## 2018-03-07 ENCOUNTER — Encounter: Payer: Self-pay | Admitting: Emergency Medicine

## 2018-03-07 DIAGNOSIS — K3589 Other acute appendicitis without perforation or gangrene: Secondary | ICD-10-CM

## 2018-03-07 DIAGNOSIS — K3580 Unspecified acute appendicitis: Secondary | ICD-10-CM | POA: Diagnosis present

## 2018-03-07 DIAGNOSIS — Z791 Long term (current) use of non-steroidal anti-inflammatories (NSAID): Secondary | ICD-10-CM | POA: Insufficient documentation

## 2018-03-07 DIAGNOSIS — Z885 Allergy status to narcotic agent status: Secondary | ICD-10-CM | POA: Insufficient documentation

## 2018-03-07 DIAGNOSIS — Z793 Long term (current) use of hormonal contraceptives: Secondary | ICD-10-CM | POA: Diagnosis not present

## 2018-03-07 DIAGNOSIS — K37 Unspecified appendicitis: Secondary | ICD-10-CM | POA: Diagnosis present

## 2018-03-07 HISTORY — PX: LAPAROSCOPIC APPENDECTOMY: SHX408

## 2018-03-07 LAB — URINALYSIS, COMPLETE (UACMP) WITH MICROSCOPIC
Bacteria, UA: NONE SEEN
Bilirubin Urine: NEGATIVE
GLUCOSE, UA: NEGATIVE mg/dL
Hgb urine dipstick: NEGATIVE
KETONES UR: 20 mg/dL — AB
Leukocytes,Ua: NEGATIVE
Nitrite: NEGATIVE
Protein, ur: NEGATIVE mg/dL
Specific Gravity, Urine: 1.023 (ref 1.005–1.030)
pH: 5 (ref 5.0–8.0)

## 2018-03-07 LAB — CBC
HCT: 41.8 % (ref 36.0–46.0)
HEMOGLOBIN: 14.1 g/dL (ref 12.0–15.0)
MCH: 29.6 pg (ref 26.0–34.0)
MCHC: 33.7 g/dL (ref 30.0–36.0)
MCV: 87.8 fL (ref 80.0–100.0)
Platelets: 323 10*3/uL (ref 150–400)
RBC: 4.76 MIL/uL (ref 3.87–5.11)
RDW: 12.1 % (ref 11.5–15.5)
WBC: 15.3 10*3/uL — ABNORMAL HIGH (ref 4.0–10.5)
nRBC: 0 % (ref 0.0–0.2)

## 2018-03-07 LAB — COMPREHENSIVE METABOLIC PANEL
ALT: 21 U/L (ref 0–44)
ANION GAP: 10 (ref 5–15)
AST: 23 U/L (ref 15–41)
Albumin: 4 g/dL (ref 3.5–5.0)
Alkaline Phosphatase: 59 U/L (ref 38–126)
BILIRUBIN TOTAL: 1.9 mg/dL — AB (ref 0.3–1.2)
BUN: 13 mg/dL (ref 6–20)
CHLORIDE: 104 mmol/L (ref 98–111)
CO2: 24 mmol/L (ref 22–32)
Calcium: 9.3 mg/dL (ref 8.9–10.3)
Creatinine, Ser: 0.74 mg/dL (ref 0.44–1.00)
Glucose, Bld: 114 mg/dL — ABNORMAL HIGH (ref 70–99)
POTASSIUM: 3.7 mmol/L (ref 3.5–5.1)
Sodium: 138 mmol/L (ref 135–145)
Total Protein: 7.2 g/dL (ref 6.5–8.1)

## 2018-03-07 LAB — LIPASE, BLOOD: LIPASE: 21 U/L (ref 11–51)

## 2018-03-07 SURGERY — APPENDECTOMY, LAPAROSCOPIC
Anesthesia: General | Site: Abdomen

## 2018-03-07 MED ORDER — DEXAMETHASONE SODIUM PHOSPHATE 10 MG/ML IJ SOLN
INTRAMUSCULAR | Status: AC
  Filled 2018-03-07: qty 1

## 2018-03-07 MED ORDER — SUGAMMADEX SODIUM 200 MG/2ML IV SOLN
INTRAVENOUS | Status: AC
  Filled 2018-03-07: qty 2

## 2018-03-07 MED ORDER — ENOXAPARIN SODIUM 40 MG/0.4ML ~~LOC~~ SOLN: 40.0000 mg | SUBCUTANEOUS | Status: DC

## 2018-03-07 MED ORDER — ACETAMINOPHEN 325 MG PO TABS: 650.0000 mg | ORAL_TABLET | Freq: Four times a day (QID) | ORAL | Status: DC | PRN

## 2018-03-07 MED ORDER — SODIUM CHLORIDE 0.9 % IV SOLN
1.0000 g | Freq: Once | INTRAVENOUS | Status: AC
  Administered 2018-03-07: 1 g via INTRAVENOUS
  Filled 2018-03-07: qty 10

## 2018-03-07 MED ORDER — PROPOFOL 10 MG/ML IV BOLUS
INTRAVENOUS | Status: AC
  Filled 2018-03-07: qty 20

## 2018-03-07 MED ORDER — ONDANSETRON 4 MG PO TBDP: 4.0000 mg | ORAL_TABLET | Freq: Four times a day (QID) | ORAL | Status: DC | PRN

## 2018-03-07 MED ORDER — IOPAMIDOL (ISOVUE-300) INJECTION 61%
30.0000 mL | Freq: Once | INTRAVENOUS | Status: AC | PRN
  Administered 2018-03-07: 30 mL via ORAL
  Filled 2018-03-07: qty 30

## 2018-03-07 MED ORDER — ACETAMINOPHEN 10 MG/ML IV SOLN
INTRAVENOUS | Status: AC
  Filled 2018-03-07: qty 100

## 2018-03-07 MED ORDER — PROPOFOL 10 MG/ML IV BOLUS
INTRAVENOUS | Status: DC | PRN
  Administered 2018-03-07: 160 mg via INTRAVENOUS

## 2018-03-07 MED ORDER — ONDANSETRON HCL 4 MG/2ML IJ SOLN: 4.0000 mg | Freq: Four times a day (QID) | INTRAMUSCULAR | Status: DC | PRN

## 2018-03-07 MED ORDER — METRONIDAZOLE IN NACL 5-0.79 MG/ML-% IV SOLN
500.0000 mg | Freq: Once | INTRAVENOUS | Status: AC
  Administered 2018-03-07: 500 mg via INTRAVENOUS
  Filled 2018-03-07: qty 100

## 2018-03-07 MED ORDER — SODIUM CHLORIDE 0.9 % IV BOLUS
1000.0000 mL | Freq: Once | INTRAVENOUS | Status: AC
  Administered 2018-03-07: 1000 mL via INTRAVENOUS

## 2018-03-07 MED ORDER — MORPHINE SULFATE (PF) 4 MG/ML IV SOLN
4.0000 mg | Freq: Once | INTRAVENOUS | Status: DC
  Filled 2018-03-07: qty 1

## 2018-03-07 MED ORDER — MIDAZOLAM HCL 2 MG/2ML IJ SOLN
INTRAMUSCULAR | Status: AC
  Filled 2018-03-07: qty 2

## 2018-03-07 MED ORDER — FENTANYL CITRATE (PF) 100 MCG/2ML IJ SOLN
INTRAMUSCULAR | Status: AC
  Filled 2018-03-07: qty 2

## 2018-03-07 MED ORDER — IBUPROFEN 600 MG PO TABS: 600.0000 mg | ORAL_TABLET | Freq: Four times a day (QID) | ORAL | Status: DC | PRN

## 2018-03-07 MED ORDER — ONDANSETRON HCL 4 MG/2ML IJ SOLN
INTRAMUSCULAR | Status: DC | PRN
  Administered 2018-03-07: 4 mg via INTRAVENOUS

## 2018-03-07 MED ORDER — SEVOFLURANE IN SOLN
RESPIRATORY_TRACT | Status: AC
  Filled 2018-03-07: qty 250

## 2018-03-07 MED ORDER — LORATADINE 10 MG PO TABS
10.0000 mg | ORAL_TABLET | Freq: Every day | ORAL | Status: DC
  Filled 2018-03-07: qty 1

## 2018-03-07 MED ORDER — ROCURONIUM BROMIDE 100 MG/10ML IV SOLN
INTRAVENOUS | Status: DC | PRN
  Administered 2018-03-07: 20 mg via INTRAVENOUS

## 2018-03-07 MED ORDER — SUGAMMADEX SODIUM 500 MG/5ML IV SOLN
INTRAVENOUS | Status: DC | PRN
  Administered 2018-03-07: 170 mg via INTRAVENOUS

## 2018-03-07 MED ORDER — IOHEXOL 300 MG/ML  SOLN
100.0000 mL | Freq: Once | INTRAMUSCULAR | Status: AC | PRN
  Administered 2018-03-07: 100 mL via INTRAVENOUS
  Filled 2018-03-07: qty 100

## 2018-03-07 MED ORDER — ONDANSETRON HCL 4 MG/2ML IJ SOLN
INTRAMUSCULAR | Status: AC
  Filled 2018-03-07: qty 2

## 2018-03-07 MED ORDER — LIDOCAINE HCL (CARDIAC) PF 100 MG/5ML IV SOSY
PREFILLED_SYRINGE | INTRAVENOUS | Status: DC | PRN
  Administered 2018-03-07: 100 mg via INTRAVENOUS

## 2018-03-07 MED ORDER — SUCCINYLCHOLINE CHLORIDE 20 MG/ML IJ SOLN
INTRAMUSCULAR | Status: DC | PRN
  Administered 2018-03-07: 100 mg via INTRAVENOUS

## 2018-03-07 MED ORDER — ACETAMINOPHEN 10 MG/ML IV SOLN
INTRAVENOUS | Status: DC | PRN
  Administered 2018-03-07: 1000 mg via INTRAVENOUS

## 2018-03-07 MED ORDER — DEXAMETHASONE SODIUM PHOSPHATE 10 MG/ML IJ SOLN
INTRAMUSCULAR | Status: DC | PRN
  Administered 2018-03-07: 10 mg via INTRAVENOUS

## 2018-03-07 MED ORDER — ONDANSETRON HCL 4 MG/2ML IJ SOLN
4.0000 mg | Freq: Once | INTRAMUSCULAR | Status: AC
  Administered 2018-03-07: 4 mg via INTRAVENOUS
  Filled 2018-03-07: qty 2

## 2018-03-07 MED ORDER — DOCUSATE SODIUM 100 MG PO CAPS: 100.0000 mg | ORAL_CAPSULE | Freq: Two times a day (BID) | ORAL | Status: DC | PRN

## 2018-03-07 MED ORDER — ROCURONIUM BROMIDE 50 MG/5ML IV SOLN
INTRAVENOUS | Status: AC
  Filled 2018-03-07: qty 1

## 2018-03-07 MED ORDER — KETAMINE HCL 50 MG/ML IJ SOLN
INTRAMUSCULAR | Status: AC
  Filled 2018-03-07: qty 10

## 2018-03-07 MED ORDER — TRAMADOL HCL 50 MG PO TABS: 50.0000 mg | ORAL_TABLET | Freq: Four times a day (QID) | ORAL | Status: DC | PRN

## 2018-03-07 MED ORDER — MIDAZOLAM HCL 2 MG/2ML IJ SOLN
INTRAMUSCULAR | Status: DC | PRN
  Administered 2018-03-07: 2 mg via INTRAVENOUS

## 2018-03-07 MED ORDER — SODIUM CHLORIDE 0.9 % IR SOLN
Status: DC | PRN
  Administered 2018-03-07: 200 mL

## 2018-03-07 MED ORDER — ACETAMINOPHEN 650 MG RE SUPP: 650.0000 mg | Freq: Four times a day (QID) | RECTAL | Status: DC | PRN

## 2018-03-07 MED ORDER — ACETAMINOPHEN 500 MG PO TABS
1000.0000 mg | ORAL_TABLET | Freq: Four times a day (QID) | ORAL | Status: DC
  Administered 2018-03-08: 1000 mg via ORAL
  Filled 2018-03-07: qty 2

## 2018-03-07 MED ORDER — BUPIVACAINE-EPINEPHRINE 0.5% -1:200000 IJ SOLN
INTRAMUSCULAR | Status: DC | PRN
  Administered 2018-03-07: 20 mL

## 2018-03-07 MED ORDER — ENOXAPARIN SODIUM 40 MG/0.4ML ~~LOC~~ SOLN
40.0000 mg | SUBCUTANEOUS | Status: DC
  Administered 2018-03-08: 40 mg via SUBCUTANEOUS
  Filled 2018-03-07: qty 0.4

## 2018-03-07 MED ORDER — FENTANYL CITRATE (PF) 100 MCG/2ML IJ SOLN
INTRAMUSCULAR | Status: DC | PRN
  Administered 2018-03-07: 50 ug via INTRAVENOUS
  Administered 2018-03-07: 100 ug via INTRAVENOUS

## 2018-03-07 MED ORDER — KETAMINE HCL 50 MG/ML IJ SOLN
INTRAMUSCULAR | Status: DC | PRN
  Administered 2018-03-07: 25 mg via INTRAMUSCULAR
  Administered 2018-03-07 (×2): 12.5 mg via INTRAMUSCULAR

## 2018-03-07 MED ORDER — TRIAMCINOLONE ACETONIDE 0.1 % EX CREA
1.0000 "application " | TOPICAL_CREAM | Freq: Two times a day (BID) | CUTANEOUS | Status: DC
  Filled 2018-03-07: qty 15

## 2018-03-07 MED ORDER — MORPHINE SULFATE (PF) 2 MG/ML IV SOLN: 1.0000 mg | INTRAVENOUS | Status: DC | PRN

## 2018-03-07 SURGICAL SUPPLY — 44 items
APPLIER CLIP 5 13 M/L LIGAMAX5 (MISCELLANEOUS) ×3
BLADE SURG SZ11 CARB STEEL (BLADE) ×3 IMPLANT
CANISTER SUCT 1200ML W/VALVE (MISCELLANEOUS) ×3 IMPLANT
CLIP APPLIE 5 13 M/L LIGAMAX5 (MISCELLANEOUS) ×1 IMPLANT
COVER WAND RF STERILE (DRAPES) IMPLANT
CUTTER FLEX LINEAR 45M (STAPLE) ×3 IMPLANT
DERMABOND ADVANCED (GAUZE/BANDAGES/DRESSINGS) ×2
DERMABOND ADVANCED .7 DNX12 (GAUZE/BANDAGES/DRESSINGS) ×1 IMPLANT
ELECT REM PT RETURN 9FT ADLT (ELECTROSURGICAL) ×3
ELECTRODE REM PT RTRN 9FT ADLT (ELECTROSURGICAL) ×1 IMPLANT
GLOVE BIOGEL PI IND STRL 7.0 (GLOVE) ×3 IMPLANT
GLOVE BIOGEL PI INDICATOR 7.0 (GLOVE) ×6
GLOVE SURG SYN 7.0 (GLOVE) ×18 IMPLANT
GOWN STRL REUS W/ TWL LRG LVL3 (GOWN DISPOSABLE) ×3 IMPLANT
GOWN STRL REUS W/TWL LRG LVL3 (GOWN DISPOSABLE) ×6
GRASPER SUT TROCAR 14GX15 (MISCELLANEOUS) ×3 IMPLANT
HANDLE YANKAUER SUCT BULB TIP (MISCELLANEOUS) ×3 IMPLANT
IRRIGATION STRYKERFLOW (MISCELLANEOUS) ×1 IMPLANT
IRRIGATOR STRYKERFLOW (MISCELLANEOUS) ×3
IV NS 1000ML (IV SOLUTION) ×2
IV NS 1000ML BAXH (IV SOLUTION) ×1 IMPLANT
KIT TURNOVER KIT A (KITS) ×3 IMPLANT
L-HOOK LAP DISP 36CM (ELECTROSURGICAL) ×3
LHOOK LAP DISP 36CM (ELECTROSURGICAL) ×1 IMPLANT
LIGASURE LAP MARYLAND 5MM 37CM (ELECTROSURGICAL) IMPLANT
NEEDLE HYPO 22GX1.5 SAFETY (NEEDLE) ×3 IMPLANT
NS IRRIG 500ML POUR BTL (IV SOLUTION) ×3 IMPLANT
PACK LAP CHOLECYSTECTOMY (MISCELLANEOUS) ×3 IMPLANT
PENCIL ELECTRO HAND CTR (MISCELLANEOUS) ×3 IMPLANT
POUCH ENDO CATCH 10MM SPEC (MISCELLANEOUS) ×3 IMPLANT
RELOAD 45 VASCULAR/THIN (ENDOMECHANICALS) ×3 IMPLANT
RELOAD STAPLE TA45 3.5 REG BLU (ENDOMECHANICALS) ×3 IMPLANT
SCISSORS METZENBAUM CVD 33 (INSTRUMENTS) ×3 IMPLANT
SET TUBE SMOKE EVAC HIGH FLOW (TUBING) ×3 IMPLANT
SUT MNCRL AB 4-0 PS2 18 (SUTURE) ×6 IMPLANT
SUT VIC AB 3-0 SH 27 (SUTURE) ×2
SUT VIC AB 3-0 SH 27X BRD (SUTURE) ×1 IMPLANT
SUT VICRYL 0 AB UR-6 (SUTURE) ×3 IMPLANT
SUT VICRYL PLUS ABS 0 54 (SUTURE) ×3 IMPLANT
TRAY FOLEY MTR SLVR 16FR STAT (SET/KITS/TRAYS/PACK) ×3 IMPLANT
TROCAR HASSON CONE 8 XI (INSTRUMENTS) ×1 IMPLANT
TROCAR HASSON CONE XI 8MM (INSTRUMENTS) ×2
TROCAR XCEL 12X100 BLDLESS (ENDOMECHANICALS) ×3 IMPLANT
TROCAR XCEL NON-BLD 5MMX100MML (ENDOMECHANICALS) ×6 IMPLANT

## 2018-03-07 NOTE — ED Notes (Signed)
OR arrived and transported pt to OR. Paperwork given and blank consent per personnel.

## 2018-03-07 NOTE — Anesthesia Post-op Follow-up Note (Signed)
Anesthesia QCDR form completed.        

## 2018-03-07 NOTE — Anesthesia Procedure Notes (Signed)
Procedure Name: Intubation Date/Time: 03/07/2018 7:36 PM Performed by: Waldo Laine, CRNA Pre-anesthesia Checklist: Patient identified, Patient being monitored, Timeout performed, Emergency Drugs available and Suction available Patient Re-evaluated:Patient Re-evaluated prior to induction Oxygen Delivery Method: Circle system utilized Preoxygenation: Pre-oxygenation with 100% oxygen Induction Type: IV induction, Rapid sequence and Cricoid Pressure applied Laryngoscope Size: Miller and 2 Grade View: Grade I Tube type: Oral Tube size: 7.0 mm Number of attempts: 1 Airway Equipment and Method: Stylet Placement Confirmation: ETT inserted through vocal cords under direct vision,  positive ETCO2 and breath sounds checked- equal and bilateral Secured at: 21 cm Tube secured with: Tape Dental Injury: Teeth and Oropharynx as per pre-operative assessment

## 2018-03-07 NOTE — H&P (Signed)
Subjective:   CC: appendicitis, acute  HPI:  Morgan Lucas is a 43 y.o. female who [facility]  above cc.  Symptoms were first noted 1 days ago. Pain is sharp, localized to RLQ.  Associated with nothing specific, exacerbated by nothing specific    Past Medical History:  has a past medical history of Allergy, Chronic bilateral low back pain without sciatica (08/30/2017), Family history of bladder cancer, Family history of colon cancer, Family history of lung cancer, Family history of ovarian cancer (08/30/2017), Family history of prostate cancer, Family history of uterine cancer, and HSV infection (08/30/2017).  Past Surgical History:  has a past surgical history that includes Tonsillectomy and Bunionectomy.  Family History: family history includes Aneurysm in her paternal grandmother; Angina in her maternal grandfather; Anxiety disorder in her sister and sister; Bladder Cancer in her father; Cancer in her maternal grandmother; Colon cancer in her maternal grandfather; Dementia in her maternal grandfather; Depression in her sister; Diabetes in her father; Heart disease in her maternal grandmother; Hyperlipidemia in her father and mother; Hypertension in her mother and sister; Leukemia in her father; Lung cancer in her father; Parkinson's disease in her maternal grandfather; Prostate cancer in her father; Skin cancer in her father; Uterine cancer in her mother.  Social History:  reports that she has never smoked. She has never used smokeless tobacco. She reports current alcohol use of about 4.0 standard drinks of alcohol per week. She reports previous drug use.  Current Medications:  aluminum chloride (DRYSOL) 20 % external solution Apply 1 application topically once a week. Tonna BoehringerSakai, Mendi Constable, DO Not Ordered  acyclovir (ZOVIRAX) 400 MG tablet Take 400 mg by mouth 5 (five) times daily. Tonna BoehringerSakai, Joel Mericle, DO Not Ordered  cetirizine (ZYRTEC) 10 MG tablet Take 10 mg by mouth daily.  Tonna BoehringerSakai, Earnest Thalman, DO Reordered  Ordered as: loratadine (CLARITIN) tablet 10 mg - 10 mg, Oral, Daily, First dose on Mon 03/07/18 at 1515  cyclobenzaprine (FLEXERIL) 5 MG tablet Take 1-2 tablets (5-10 mg total) by mouth 2 (two) times daily as needed for muscle spasms. Tonna BoehringerSakai, Dyneshia Baccam, DO Not Ordered  Diclofenac Sodium (PENNSAID) 2 % SOLN Place 2 g onto the skin 2 (two) times daily. Tonna BoehringerSakai, Keiston Manley, DO Not Ordered  Lysine 500 MG CAPS Take by mouth. Sung AmabileSakai, Emmy Keng, DO Not Ordered  Multiple Vitamins-Calcium (ONE-A-DAY WOMENS PO) Take by mouth. Tonna BoehringerSakai, Velinda Wrobel, DO Not Ordered  predniSONE (DELTASONE) 20 MG tablet 3 tabs po day one, then 2 po daily x 4 days Jasson Siegmann, [place], DO Not Ordered  triamcinolone cream (KENALOG) 0.1 % Apply 1 application topically 2 (two) times daily. Tonna BoehringerSakai, Odessa Nishi, DO Reordered  Ordered as: triamcinolone cream (KENALOG) 0.1 % 1 application - 1 application, Topical, 2 times daily, First dose on Mon 03/07/18 at 1515 Apply to affected area      Allergies:  Allergies as of 03/07/2018 - Review Complete 03/07/2018  Allergen Reaction Noted  . 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 Codeine  08/08/2017  . Nickel  08/08/2017  . Pistachio nut (diagnostic)  08/08/2017  . Silver  08/08/2017    ROS:  General: Denies weight loss, weight gain, fatigue, fevers, chills, and night sweats. Eyes: Denies blurry vision, double vision, eye pain, itchy eyes, and tearing. Ears: Denies hearing loss, earache, and ringing in ears. Nose: Denies sinus pain, congestion, infections, runny nose, and nosebleeds. Mouth/throat: Denies hoarseness, sore throat, bleeding gums, and difficulty swallowing. Heart: Denies chest pain, palpitations, racing heart, irregular heartbeat, leg  pain or swelling, and decreased activity tolerance. Respiratory: Denies breathing difficulty, shortness of breath, wheezing, cough, and sputum. GI: Denies change in appetite, heartburn, nausea, vomiting, constipation, diarrhea, and blood in stool. GU:  Denies difficulty urinating, pain with urinating, urgency, frequency, blood in urine. Musculoskeletal: Denies joint stiffness, pain, swelling, muscle weakness. Skin: Denies rash, itching, mass, tumors, sores, and boils Neurologic: Denies headache, fainting, dizziness, seizures, numbness, and tingling. Psychiatric: Denies depression, anxiety, difficulty sleeping, and memory loss. Endocrine: Denies heat or cold intolerance, and increased thirst or urination. Blood/lymph: Denies easy bruising, easy bruising, and swollen glands     Objective:     BP 134/68 (BP Location: Left Arm)   Pulse 77   Temp 98.1 F (36.7 C) (Oral)   Resp 20   Ht 5\' 2"  (1.575 m)   Wt 83.5 kg   LMP 03/04/2018 (Exact Date)   SpO2 99%   BMI 33.65 kg/m    Constitutional :  alert, cooperative, appears stated age and no distress  Lymphatics/Throat:  no asymmetry, masses, or scars  Respiratory:  clear to auscultation bilaterally  Cardiovascular:  regular rate and rhythm  Gastrointestinal: soft, no guarding, focal tenderness in RLQ.   Musculoskeletal: Steady gait and movement  Skin: Cool and moist  Psychiatric: Normal affect, non-agitated, not confused       LABS:  CMP Latest Ref Rng & Units 03/07/2018  Glucose 70 - 99 mg/dL 016(P)  BUN 6 - 20 mg/dL 13  Creatinine 5.37 - 4.82 mg/dL 7.07  Sodium 867 - 544 mmol/L 138  Potassium 3.5 - 5.1 mmol/L 3.7  Chloride 98 - 111 mmol/L 104  CO2 22 - 32 mmol/L 24  Calcium 8.9 - 10.3 mg/dL 9.3  Total Protein 6.5 - 8.1 g/dL 7.2  Total Bilirubin 0.3 - 1.2 mg/dL 9.2(E)  Alkaline Phos 38 - 126 U/L 59  AST 15 - 41 U/L 23  ALT 0 - 44 U/L 21   CBC Latest Ref Rng & Units 03/07/2018  WBC 4.0 - 10.5 K/uL 15.3(H)  Hemoglobin 12.0 - 15.0 g/dL 10.0  Hematocrit 71.2 - 46.0 % 41.8  Platelets 150 - 400 K/uL 323     RADS: CLINICAL DATA:  43 year old female with history of generalized abdominal pain since 8 p.m. yesterday evening. Nausea and vomiting.  EXAM: CT ABDOMEN AND  PELVIS WITH CONTRAST  TECHNIQUE: Multidetector CT imaging of the abdomen and pelvis was performed using the standard protocol following bolus administration of intravenous contrast.  CONTRAST:  OMNIPAQUE IOHEXOL 300 MG/ML  SOLN  COMPARISON:  No priors.  FINDINGS: Lower chest: Unremarkable.  Hepatobiliary: No suspicious cystic or solid hepatic lesions. No intra or extrahepatic biliary ductal dilatation. Gallbladder is normal in appearance.  Pancreas: No pancreatic mass. No pancreatic ductal dilatation. No pancreatic or peripancreatic fluid or inflammatory changes.  Spleen: Unremarkable.  Adrenals/Urinary Tract: Bilateral kidneys and bilateral adrenal glands are normal in appearance. No hydroureteronephrosis. Urinary bladder is normal in appearance.  Stomach/Bowel: Normal appearance of the stomach. No pathologic dilatation of small bowel or colon. A few scattered colonic diverticulae are noted, without surrounding inflammatory changes to suggest an acute diverticulitis at this time. Appendix is dilated and inflamed, as detailed below.  Appendix: Location: Inferior and medial to the cecum.  Diameter: 10 mm  Appendicolith: Present.  Mucosal hyper-enhancement: Present.  Extraluminal gas: None.  Periappendiceal collection: None.  Vascular/Lymphatic: No significant atherosclerotic disease, aneurysm or dissection noted in the abdominal or pelvic vasculature. Retroaortic left renal vein (normal anatomical variant). No lymphadenopathy  noted in the abdomen or pelvis.  Reproductive: Uterus and ovaries are unremarkable in appearance.  Other: No significant volume of ascites.  No pneumoperitoneum.  Musculoskeletal: There are no aggressive appearing lytic or blastic lesions noted in the visualized portions of the skeleton.  IMPRESSION: 1. Findings are compatible with an acute appendicitis, as detailed above. At this time, there is no  periappendiceal abscess or signs of frank perforation. Surgical consultation is recommended.  Critical Value/emergent results were called by telephone at the time of interpretation on 03/07/2018 at 1:39 pm to Dr. Dionne Bucy , who verbally acknowledged these results.   Electronically Signed   By: Trudie Reed M.D.   On: 03/07/2018 13:39 Assessment:      Acute appendicitis  Plan:      Discussed the risk of surgery including post-op infxn, seroma, hematoma, abscess formation, chronic pain, poor-delayed wound healing, possible bowel resection, possible ostomy, possible conversion to open procedure, post-op SBO or ileus, and need for additional procedures to address said risks.  The risks of general anesthetic including MI, CVA, sudden death or even reaction to anesthetic medications also discussed. Alternatives include continued observation, or antibiotic treatment.  Benefits include possible symptom relief,   Typical post operative recovery of 3-5 days rest, also discussed.  The patient understands the risks, any and all questions were answered to the patient's satisfaction.  To OR for lap appy

## 2018-03-07 NOTE — Transfer of Care (Signed)
Immediate Anesthesia Transfer of Care Note  Patient: Morgan Lucas  Procedure(s) Performed: APPENDECTOMY LAPAROSCOPIC (N/A Abdomen)  Patient Location: PACU  Anesthesia Type:General  Level of Consciousness: sedated  Airway & Oxygen Therapy: Patient Spontanous Breathing and Patient connected to face mask oxygen  Post-op Assessment: Report given to RN and Post -op Vital signs reviewed and stable  Post vital signs: Reviewed and stable  Last Vitals:  Vitals Value Taken Time  BP 134/69 03/07/2018  9:09 PM  Temp 36.2 C 03/07/2018  9:09 PM  Pulse 108 03/07/2018  9:10 PM  Resp 24 03/07/2018  9:10 PM  SpO2 100 % 03/07/2018  9:10 PM  Vitals shown include unvalidated device data.  Last Pain:  Vitals:   03/07/18 1655  TempSrc: Oral  PainSc: 4          Complications: No apparent anesthesia complications

## 2018-03-07 NOTE — ED Notes (Signed)
Dr. Myrtie Soman at bedside.

## 2018-03-07 NOTE — ED Triage Notes (Signed)
Pt reports generalized abdominal pain that started around 8pm last pm. Pt reports pain is stabbing in nature and constant. Pt reports NV as well. Denies urinary sx's.

## 2018-03-07 NOTE — ED Notes (Signed)
Patient transported to CT 

## 2018-03-07 NOTE — ED Triage Notes (Signed)
Pt reports that she ate a piece of cheese last pm, a sample from [employer] and it was shortly after that when her pain started.

## 2018-03-07 NOTE — Op Note (Signed)
Preoperative diagnosis: Acute appendicitis.  Postoperative diagnosis: Acute appendicitis  Procedure: Laparoscopic appendectomy.  Anesthesia: GETA  Surgeon: Sung Amabile  Wound Classification: clean contaminated  Specimen: Appendix  Complications: None  Estimated Blood Loss: 20 mL   Indications: Patient is a 43 y.o. female  presented with right lower quadrant pain.  Computed tomography scan and physical examination were consistent with acute appendicitis.   Findings: 1. Acutely inflamed appendix 2. No peri-appendiceal abscess or phlegmon 3. Normal anatomy 4. Appendiceal artery ligated and divided with EndoGIA 5. Adequate hemostasis.   Description of procedure: The patient was placed on the operating table in the supine position, left arm tucked. General anesthesia was induced. A time-out was completed verifying correct patient, procedure, site, positioning, and implant(s) and/or special equipment prior to beginning this procedure. A Foley catheter placed. The abdomen was prepped and draped in the usual sterile fashion.   After local was infused, an incision was made inferior to the umbilicus.  The fascia was elevated with Coker's and 0 Vicryl stay sutures were placed on either side of the midline.  11 blade was then used to make an midline incision down through the fascia blunt dissection used to enter the peritoneal cavity under direct visualization.  Hasson port then placed through this and abdomen insufflated with carbon dioxide to a pressure of 15 mmHg. The patient tolerated insufflation well.  The laparoscope was inserted and the abdomen inspected. No injuries from initial trocar placement were noted. One 10mm port and another 5-mm port was then placed above the symphysis pubis on midline and LLQ.  Care was taken to avoid injury to the bladder or inferior epigastric vessels. The table was placed in the Trendelenburg position with the right side elevated.  An inflamed appendix was  identified and elevated.  Window created at base of appendix in the mesentery.   An endoscopic blue load linear cutting stapler was then used to divide and staple the base of the appendix. It was reloaded with a vascular cartridge and the mesoappendix similarly divided. Persistent bleeding from artery clipped with endoclip x2.  The appendix was placed in an endoscopic retrieval bag and removed.   The appendiceal stump and mesoappendix was examined and hemostasis noted. serosanguinous fluid and minimal blood suctioned out.  No other pathology was identified within pelvis.  trocars were removed under direct vision.  The abdomen was allowed to collapse. The umbilical trocar port site closed with  0 vicryl figure eight x2.  Deep dermal then closed with 3-0 vicryl interrupted fashion.  All skin incisions then closed with subcuticular sutures Monocryl 4-0.  Wounds then dressed with dermabond.  The patient tolerated the procedure well, foley removed, awakened from anesthesia and was taken to the postanesthesia care unit in satisfactory condition.  Foley removed.  Sponge count and instrument count correct at the end of the procedure.

## 2018-03-07 NOTE — ED Provider Notes (Signed)
[facility] Emergency Department Provider Note ____________________________________________   First MD Initiated Contact with Patient 03/07/18 1148     (approximate)  I have reviewed the triage vital signs and the nursing notes.   HISTORY  Chief Complaint Abdominal Pain and Nausea    HPI Morgan Lucas is a 43 y.o. female with no significant PMH who presents with abdominal pain, acute onset last night, generalized, and described as sharp.  It is slightly worse on the right.  She reports nausea with one episode of vomiting.  She denies any significant change in her bowel movements, or any urinary symptoms, vaginal bleeding, or discharge.  No prior history of this pain.  She states she ate dinner around 5:30 PM yesterday and then had a sample of cheese from the supermarket.  The pain started shortly after this.  Past Medical History:  Diagnosis Date  . Allergy   . Chronic bilateral low back pain without sciatica 08/30/2017  . Family history of bladder cancer   . Family history of colon cancer   . Family history of lung cancer   . Family history of ovarian cancer 08/30/2017  . Family history of prostate cancer   . Family history of uterine cancer   . 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 Appendicitis 03/07/2018  . Nonallopathic lesion of sacral region 10/15/2017  . Nonallopathic lesion of pelvic region 10/15/2017  . Nonallopathic lesion of thoracic region 10/15/2017  . Nonallopathic lesion of lumbosacral region 10/15/2017  . Family history of prostate cancer   . Family history of bladder cancer   . Family history of uterine cancer   . Family history of colon cancer   . Family history of lung cancer   . 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 Rash and nonspecific skin eruption 08/30/2017  . 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 Chronic bilateral low back pain without sciatica 08/30/2017    Past  Surgical History:  Procedure Laterality Date  . BUNIONECTOMY    . TONSILLECTOMY      Prior to Admission medications   Medication Sig Start Date End Date Taking? Authorizing Provider  aluminum chloride (DRYSOL) 20 % external solution Apply 1 application topically once a week. 01/14/16  Yes [provider]  EPINEPHrine 0.3 mg/0.3 mL IJ SOAJ injection Inject 0.3 mLs into the muscle as needed. 11/23/16  Yes [provider]  acyclovir (ZOVIRAX) 400 MG tablet Take 400 mg by mouth 5 (five) times daily.    [provider]  cetirizine (ZYRTEC) 10 MG tablet Take 10 mg by mouth daily.    [provider]  cyclobenzaprine (FLEXERIL) 5 MG tablet Take 1-2 tablets (5-10 mg total) by mouth 2 (two) times daily as needed for muscle spasms. 09/11/17   Lurene Shadow, PA-C  Diclofenac Sodium (PENNSAID) 2 % SOLN Place 2 g onto the skin 2 (two) times daily. 10/15/17   Judi Saa, DO  levonorgestrel-ethinyl estradiol (AVIANE,ALESSE,LESSINA) 0.1-20 MG-MCG tablet Take 1 tablet by mouth daily.    [provider]  Lysine 500 MG CAPS Take by mouth.    [provider]  Multiple Vitamins-Calcium (ONE-A-DAY WOMENS PO) Take by mouth.    [provider]  predniSONE (DELTASONE) 20 MG tablet 3 tabs po day one, then 2 po daily x 4 days 09/11/17   Lurene Shadow, PA-C  triamcinolone cream (KENALOG) 0.1 % Apply 1 application topically 2 (two)  times daily.    [provider]    Allergies Cashew nut oil; Codeine; Nickel; Pistachio nut (diagnostic); and Silver  Family History  Problem Relation Age of Onset  . Hypertension Mother   . Hyperlipidemia Mother   . Uterine cancer Mother        dx over the age of 77  . Diabetes Father   . Hyperlipidemia Father   . Prostate cancer Father        'slow growing'  . Lung cancer Father   . Bladder Cancer Father   . Leukemia Father        CLL  . 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  Hypertension Sister   . Anxiety disorder Sister   . Heart disease Maternal Grandmother   . Cancer Maternal Grandmother        blood cancer  . Parkinson's disease Maternal Grandfather   . Angina Maternal Grandfather   . Dementia Maternal Grandfather   . Colon cancer Maternal Grandfather   . Anxiety disorder Sister   . Depression Sister   . Aneurysm Paternal Grandmother        'young'    Social History Social History   Tobacco Use  . Smoking status: Never Smoker  . Smokeless tobacco: Never Used  Substance Use Topics  . Alcohol use: Yes    Alcohol/week: 4.0 standard drinks    Types: 4 Glasses of wine per week  . Drug use: Not Currently    Review of Systems  Constitutional: No fever. Eyes: No redness. ENT: No sore throat. Cardiovascular: Denies chest pain. Respiratory: Denies shortness of breath. Gastrointestinal: Positive for nausea and vomiting. Genitourinary: Negative for dysuria.  Musculoskeletal: Negative for back pain. Skin: Negative for rash. Neurological: Negative for headache.   ____________________________________________   PHYSICAL EXAM:  VITAL SIGNS: ED Triage Vitals  Enc Vitals Group     BP 03/07/18 1026 134/68     Pulse Rate 03/07/18 1026 77     Resp 03/07/18 1026 20     Temp 03/07/18 1028 98.1 F (36.7 C)     Temp Source 03/07/18 1028 Oral     SpO2 03/07/18 1026 99 %     Weight 03/07/18 1027 184 lb (83.5 kg)     Height 03/07/18 1027 5\' 2"  (1.575 m)     Head Circumference --      Peak Flow --      Pain Score 03/07/18 1027 8     Pain Loc --      Pain Edu? --      Excl. in GC? --     Constitutional: Alert and oriented.  Relatively well appearing and in no acute distress. Eyes: Conjunctivae are normal.  No scleral icterus. Head: Atraumatic. Nose: No congestion/rhinnorhea. Mouth/Throat: Mucous membranes are slightly dry.   Neck: Normal range of motion.  Cardiovascular: Good peripheral circulation. Respiratory: Normal respiratory effort.   No retractions. Gastrointestinal: Soft with mild right mid and lower quadrant tenderness. No distention.  Genitourinary: No flank tenderness. Musculoskeletal:   Extremities warm and well perfused.  Neurologic:  Normal speech and language. No gross focal neurologic deficits are appreciated.  Skin:  Skin is warm and dry. No rash noted. Psychiatric: Mood and affect are normal. Speech and behavior are normal.  ____________________________________________   LABS (all labs ordered are listed, but only abnormal results are displayed)  Labs Reviewed  COMPREHENSIVE METABOLIC PANEL - Abnormal; Notable  for the following components:      Result Value   Glucose, Bld 114 (*)    Total Bilirubin 1.9 (*)    All other components within normal limits  CBC - Abnormal; Notable for the following components:   WBC 15.3 (*)    All other components within normal limits  URINALYSIS, COMPLETE (UACMP) WITH MICROSCOPIC - Abnormal; Notable for the following components:   Color, Urine YELLOW (*)    APPearance CLEAR (*)    Ketones, ur 20 (*)    All other components within normal limits  LIPASE, BLOOD   ____________________________________________  EKG   ____________________________________________  RADIOLOGY  CT abdomen: Findings consistent with acute appendicitis  ____________________________________________   PROCEDURES  Procedure(s) performed: No  Procedures  Critical Care performed: No ____________________________________________   INITIAL IMPRESSION / ASSESSMENT AND PLAN / ED COURSE  Pertinent labs & imaging results that were available during my care of the patient were reviewed by me and considered in my medical decision making (see chart for details).  43 year old female with no significant past medical history presents with acute onset of generalized abdominal pain since last night associated with nausea and vomiting.  She denies fever, urinary symptoms, diarrhea, or vaginal  bleeding.  On exam the patient is well-appearing.  Her vital signs are normal.  Her abdomen is soft but she does have asymmetrical tenderness on the right both in the mid abdomen and right lower quadrant.  Her history is more compatible with gastritis, PUD, or early gastroenteritis, however given the sharp and persistent nature of the pain (rather than cramping) as well as the tenderness in the right lower quadrant, I am concerned for appendicitis, colitis, diverticulitis, or less likely hepatobiliary cause.  We will obtain CT and reassess.  ----------------------------------------- 3:03 PM on 03/07/2018 -----------------------------------------  CT shows findings right consistent with acute appendicitis.  The patient initially requested transfer to [facility] as it is closer to where she lives and her mother who is here with her and is elderly may have some difficulty getting back home if the patient is admitted.  I contacted the transfer center and discussed the case with Dr. Corliss Skains.  However, the patient subsequently changed her mind and decided that she would prefer to stay here.  I consulted Dr. Tonna Boehringer from general surgery who is come to evaluate the patient.  She will be admitted.  Antibiotics have been given.   ____________________________________________   FINAL CLINICAL IMPRESSION(S) / ED DIAGNOSES  Final diagnoses:  Acute appendicitis, unspecified acute appendicitis type      NEW MEDICATIONS STARTED DURING THIS VISIT:  New Prescriptions   No medications on file     Note:  This document was prepared using Dragon voice recognition software and may include unintentional dictation errors.    Dionne Bucy, MD 03/07/18 1517

## 2018-03-07 NOTE — ED Notes (Signed)
Pt up to toilet 

## 2018-03-07 NOTE — ED Notes (Signed)
First Nurse Note: Patient to ED from [facility] with "really bad abdominal pain" since last PM.  States she feels "bad".

## 2018-03-07 NOTE — Anesthesia Preprocedure Evaluation (Addendum)
Anesthesia Evaluation  Patient identified by MRN, date of birth, ID band Patient awake    Reviewed: Allergy & Precautions, NPO status , Patient's Chart, lab work & pertinent test results  Airway Mallampati: III  TM Distance: <3 FB     Dental  (+) Caps, Teeth Intact, Dental Advisory Given   Pulmonary neg pulmonary ROS,    Pulmonary exam normal        Cardiovascular negative cardio ROS Normal cardiovascular exam     Neuro/Psych negative neurological ROS  negative psych ROS   GI/Hepatic Neg liver ROS,   Endo/Other  negative endocrine ROS  Renal/GU negative Renal ROS  negative genitourinary   Musculoskeletal  (+) Arthritis ,   Abdominal Normal abdominal exam  (+)   Peds negative pediatric ROS (+)  Hematology negative hematology ROS (+)   Anesthesia Other Findings Past Medical History: No date: Allergy 08/30/2017: Chronic bilateral low back pain without sciatica No date: Family history of bladder cancer No date: Family history of colon cancer No date: Family history of lung cancer 08/30/2017: Family history of ovarian cancer No date: Family history of prostate cancer No date: Family history of uterine cancer 08/30/2017: HSV infection  Reproductive/Obstetrics                            Anesthesia Physical Anesthesia Plan  ASA: II and emergent  Anesthesia Plan: General   Post-op Pain Management:    Induction: Intravenous, Rapid sequence and Cricoid pressure planned  PONV Risk Score and Plan:   Airway Management Planned: Oral ETT  Additional Equipment:   Intra-op Plan:   Post-operative Plan: Extubation in OR  Informed Consent: I have reviewed the patients History and Physical, chart, labs and discussed the procedure including the risks, benefits and alternatives for the proposed anesthesia with the patient or authorized representative who has indicated his/her understanding and  acceptance.     Dental advisory given  Plan Discussed with: CRNA and Surgeon  Anesthesia Plan Comments:         Anesthesia Quick Evaluation

## 2018-03-08 ENCOUNTER — Encounter: Payer: Self-pay | Admitting: Surgery

## 2018-03-08 ENCOUNTER — Other Ambulatory Visit: Payer: Self-pay

## 2018-03-08 LAB — PHOSPHORUS: Phosphorus: 3.1 mg/dL (ref 2.5–4.6)

## 2018-03-08 LAB — BASIC METABOLIC PANEL
Anion gap: 9 (ref 5–15)
BUN: 10 mg/dL (ref 6–20)
CO2: 23 mmol/L (ref 22–32)
Calcium: 8.7 mg/dL — ABNORMAL LOW (ref 8.9–10.3)
Chloride: 105 mmol/L (ref 98–111)
Creatinine, Ser: 0.74 mg/dL (ref 0.44–1.00)
GFR calc Af Amer: >60 mL/min (ref >60)
GFR calc non Af Amer: >60 mL/min (ref >60)
GLUCOSE: 167 mg/dL — AB (ref 70–99)
Potassium: 3.9 mmol/L (ref 3.5–5.1)
Sodium: 137 mmol/L (ref 135–145)

## 2018-03-08 LAB — CBC WITH DIFFERENTIAL/PLATELET
ABS IMMATURE GRANULOCYTES: 0.06 10*3/uL (ref 0.00–0.07)
Basophils Absolute: 0 10*3/uL (ref 0.0–0.1)
Basophils Relative: 0 %
Eosinophils Absolute: 0 10*3/uL (ref 0.0–0.5)
Eosinophils Relative: 0 %
HEMATOCRIT: 40.8 % (ref 36.0–46.0)
HEMOGLOBIN: 13.6 g/dL (ref 12.0–15.0)
Immature Granulocytes: 0 %
Lymphocytes Relative: 8 %
Lymphs Abs: 1.1 10*3/uL (ref 0.7–4.0)
MCH: 29.4 pg (ref 26.0–34.0)
MCHC: 33.3 g/dL (ref 30.0–36.0)
MCV: 88.3 fL (ref 80.0–100.0)
Monocytes Absolute: 0.1 10*3/uL (ref 0.1–1.0)
Monocytes Relative: 1 %
NEUTROS ABS: 12.7 10*3/uL — AB (ref 1.7–7.7)
Neutrophils Relative %: 91 %
PLATELETS: 324 10*3/uL (ref 150–400)
RBC: 4.62 MIL/uL (ref 3.87–5.11)
RDW: 12.4 % (ref 11.5–15.5)
WBC: 14.1 10*3/uL — ABNORMAL HIGH (ref 4.0–10.5)
nRBC: 0 % (ref 0.0–0.2)

## 2018-03-08 LAB — MAGNESIUM: Magnesium: 2.1 mg/dL (ref 1.7–2.4)

## 2018-03-08 MED ORDER — ACETAMINOPHEN 325 MG PO TABS: 650.0000 mg | ORAL_TABLET | Freq: Three times a day (TID) | ORAL | 0 refills | Status: AC | PRN

## 2018-03-08 MED ORDER — IBUPROFEN 800 MG PO TABS: 800.0000 mg | ORAL_TABLET | Freq: Three times a day (TID) | ORAL | 0 refills | Status: AC | PRN

## 2018-03-08 NOTE — Discharge Instructions (Signed)
Laparoscopic Appendectomy, Adult, Care After This sheet gives you information about how to care for yourself after your procedure. Your doctor may also give you more specific instructions. If you have problems or questions, contact your doctor. What can I expect after the procedure? After the procedure, it is common to have:  Little energy for normal activities.  Mild pain in the area where the cuts from surgery (incisions) were made.  Trouble pooping (constipation). This can be caused by: ? Pain medicine. ? A lack of activity. Follow these instructions at home: Medicines  tylenol and advil as needed for discomfort.  Please alternate between the two every four hours as needed for pain.     325-650mg  every 8hrs to max of 4000mg /24hrs for the tylenol.    Advil up to 800mg  per dose every 8hrs as needed for pain.    Do not drive or use heavy machinery while taking prescription pain medicine.  Ask your doctor if the medicine you are taking can cause trouble pooping. You may need to take steps to prevent or treat trouble pooping: ? Drink enough fluid to keep your pee (urine) pale yellow. ? Take over-the-counter or prescription medicines. ? Eat foods that are high in fiber. These include beans, whole grains, and fresh fruits and vegetables. ? Limit foods that are high in fat and sugar. These include fried or sweet foods. Incision care   Follow instructions from your doctor about how to take care of your cuts from surgery. Make sure you: ? Wash your hands with soap and water before and after you change your bandage (dressing). If you cannot use soap and water, use hand sanitizer. ? Change your bandage as told by your doctor. ? Leave stitches (sutures), skin glue, or skin tape (adhesive) strips in place. They may need to stay in place for 2 weeks or longer. If tape strips get loose and curl up, you may trim the loose edges. Do not remove tape strips completely unless your doctor says it is  okay.  Check your cuts from surgery every day for signs of infection. Check for: ? Redness, swelling, or pain. ? Fluid or blood. ? Warmth. ? Pus or a bad smell. Bathing  Keep your cuts from surgery clean and dry. Clean them as told by your doctor. To do this: 1. Gently wash the cuts with soap and water. 2. Rinse the cuts with water to remove all soap. 3. Pat the cuts dry with a clean towel. Do not rub the cuts. Do not take baths, swim, or use a hot tub for 2 weeks, or until your doctor says it is okay. You may take showers TONIGHT Activity   Do not drive for 24 hours if you were given a medicine to help you relax (sedative) during your procedure.  Rest after the procedure. Return to your normal activities as told by your doctor. Ask your doctor what activities are safe for you.  For 3 weeks, or for as long as told by your doctor: ? Do not lift anything that is heavier than 10 lb (4.5 kg), or the limit that you are told. ? Do not play contact sports. General instructions  If you were sent home with a drain, follow instructions from your doctor on how to care for it.  Take deep breaths. This helps to keep your lungs from getting an infection (pneumonia).  Keep all follow-up visits as told by your doctor. This is important. Contact a doctor if:  You have  redness, swelling, or pain around a cut from surgery.  You have fluid or blood coming from a cut.  Your cut feels warm to the touch.  You have pus or a bad smell coming from a cut or a bandage.  The edges of a cut break open after the stitches have been taken out.  You have pain in your shoulders that gets worse.  You feel dizzy or you pass out (faint).  You have shortness of breath.  You keep feeling sick to your stomach (nauseous).  You keep throwing up (vomiting).  You get watery poop (diarrhea) or you cannot control your poop.  You lose your appetite.  You have swelling or pain in your legs.  You get a  rash. Get help right away if:  You have a fever.  You have trouble breathing.  You have sharp pains in your chest. Summary  After the procedure, it is common to have low energy, mild pain, and trouble pooping.  Infection is a common problem after this procedure. Follow your doctor's instructions about caring for yourself after the procedure.  Rest after the procedure. Return to your normal activities as told by your doctor.  Contact your doctor if you see signs of infection around your cuts from surgery, or you get short of breath. Get help right away if you have a fever, chest pain, or trouble breathing. This information is not intended to replace advice given to you by your health care provider. Make sure you discuss any questions you have with your health care provider. Document Released: 10/18/2008 Document Revised: 06/24/2017 Document Reviewed: 06/24/2017 Elsevier Interactive Patient Education  2019 [employer].

## 2018-03-08 NOTE — Discharge Summary (Signed)
Physician Discharge Summary  Patient ID: Morgan Lucas MRN: [id number] DOB/AGE: [birth year] 43 y.o.  Admit date: 03/07/2018 Discharge date: 03/08/2018  Admission Diagnoses: acute appendicitis   Discharge Diagnoses:  Same as above  Discharged Condition: good  Hospital Course: dx with appendicitis, underwent uncomplicated lap appy, recovered well  Consults: None  Discharge Exam: Blood pressure 124/66, pulse 74, temperature 98.4 F (36.9 C), temperature source Oral, resp. rate 20, height 5\' 2"  (1.575 m), weight 83.5 kg, last menstrual period 03/04/2018, SpO2 97 %. General appearance: alert, cooperative and no distress GI: soft, some tenderness around incisions Incision/Wound: c/d/i.  minimal bruising around umbilical site  Disposition:  Discharge disposition: 01-Home or Self Care       Discharge Instructions    Discharge patient   Complete by:  As directed    Discharge disposition:  01-Home or Self Care   Discharge patient date:  03/08/2018     Allergies as of 03/08/2018      Reactions   Cashew Nut Oil Anaphylaxis   As per pt   Codeine    Hydrocodone-acetaminophen Nausea Only   Nickel    Other    Other reaction(s): Other, see comments PN: CASHEWS: Anaphylaxis, PISTATCHIO: Itchy throat, Silver and nickel: Rash   Pistachio Nut (diagnostic)    Has Epi pen   Silver       Medication List    TAKE these medications   acetaminophen 325 MG tablet Commonly known as:  TYLENOL Take 2 tablets (650 mg total) by mouth every 8 (eight) hours as needed for up to 30 days for mild pain.   acyclovir 400 MG tablet Commonly known as:  ZOVIRAX Take 400 mg by mouth 5 (five) times daily.   cetirizine 10 MG tablet Commonly known as:  ZYRTEC Take 10 mg by mouth daily.   cyclobenzaprine 5 MG tablet Commonly known as:  FLEXERIL Take 1-2 tablets (5-10 mg total) by mouth 2 (two) times daily as needed for muscle spasms.   Diclofenac Sodium 2 % Soln Commonly known as:   PENNSAID Place 2 g onto the skin 2 (two) times daily.   DRYSOL 20 % external solution Generic drug:  aluminum chloride Apply 1 application topically once a week.   EPINEPHrine 0.3 mg/0.3 mL Soaj injection Commonly known as:  EPI-PEN Inject 0.3 mLs into the muscle as needed.   ibuprofen 800 MG tablet Commonly known as:  ADVIL,MOTRIN Take 1 tablet (800 mg total) by mouth every 8 (eight) hours as needed for mild pain or moderate pain.   levonorgestrel-ethinyl estradiol 0.1-20 MG-MCG tablet Commonly known as:  AVIANE,ALESSE,LESSINA Take 1 tablet by mouth daily.   Lysine 500 MG Caps Take by mouth.   ONE-A-DAY WOMENS PO Take by mouth.   predniSONE 20 MG tablet Commonly known as:  DELTASONE 3 tabs po day one, then 2 po daily x 4 days   triamcinolone cream 0.1 % Commonly known as:  KENALOG Apply 1 application topically 2 (two) times daily.      Follow-up Information    Tonna Boehringer, Raivyn Kabler, DO Follow up in 2 week(s).   Specialty:  Surgery Contact information: [address] [place] [address] [phone]            Total time spent arranging discharge was >53min. Signed: Sung Amabile 03/08/2018, 7:29 AM

## 2018-03-08 NOTE — Progress Notes (Signed)
Pt completed breakfast and has not had any nausea. D/C instructions provided, pt states understanding, aware of follow up appt. D/C home to car via auxiliary wheelchair.

## 2018-03-09 LAB — HIV ANTIBODY (ROUTINE TESTING W REFLEX): HIV Screen 4th Generation wRfx: NONREACTIVE

## 2018-03-09 LAB — SURGICAL PATHOLOGY

## 2018-03-11 NOTE — Anesthesia Postprocedure Evaluation (Signed)
Anesthesia Post Note  Patient: Morgan Lucas  Procedure(s) Performed: APPENDECTOMY LAPAROSCOPIC (N/A Abdomen)  Patient location during evaluation: PACU Anesthesia Type: General Level of consciousness: oriented and awake and alert Pain management: pain level controlled Vital Signs Assessment: post-procedure vital signs reviewed and stable Respiratory status: spontaneous breathing Cardiovascular status: blood pressure returned to baseline Anesthetic complications: no     Last Vitals:  Vitals:   03/08/18 0304 03/08/18 0740  BP: 124/66 126/62  Pulse: 74 80  Resp:  18  Temp: 36.9 C 36.7 C  SpO2: 97% 98%    Last Pain:  Vitals:   03/08/18 0800  TempSrc:   PainSc: 0-No pain                 Minka Knight

## 2018-03-15 ENCOUNTER — Other Ambulatory Visit: Payer: Self-pay | Admitting: Obstetrics & Gynecology

## 2018-03-15 DIAGNOSIS — Z1231 Encounter for screening mammogram for malignant neoplasm of breast: Secondary | ICD-10-CM

## 2018-06-15 DIAGNOSIS — K5730 Diverticulosis of large intestine without perforation or abscess without bleeding: Secondary | ICD-10-CM | POA: Insufficient documentation

## 2018-09-06 ENCOUNTER — Ambulatory Visit: Payer: Managed Care, Other (non HMO) | Admitting: Podiatry

## 2018-09-15 ENCOUNTER — Other Ambulatory Visit: Payer: Self-pay

## 2018-09-15 ENCOUNTER — Ambulatory Visit (INDEPENDENT_AMBULATORY_CARE_PROVIDER_SITE_OTHER): Payer: Managed Care, Other (non HMO) | Admitting: Podiatry

## 2018-09-15 ENCOUNTER — Encounter: Payer: Self-pay | Admitting: Podiatry

## 2018-09-15 ENCOUNTER — Ambulatory Visit (INDEPENDENT_AMBULATORY_CARE_PROVIDER_SITE_OTHER): Payer: Managed Care, Other (non HMO)

## 2018-09-15 VITALS — BP 106/52 | HR 85 | Resp 16

## 2018-09-15 DIAGNOSIS — M2011 Hallux valgus (acquired), right foot: Secondary | ICD-10-CM | POA: Diagnosis not present

## 2018-09-15 NOTE — Patient Instructions (Signed)
Pre-Operative Instructions  Congratulations, you have decided to take an important step towards improving your quality of life.  You can be assured that the doctors and staff at [facility] will be with you every step of the way.  Here are some important things you should know:  1. Plan to be at the surgery center/hospital at least 1 (one) hour prior to your scheduled time, unless otherwise directed by the surgical center/hospital staff.  You must have a responsible adult accompany you, remain during the surgery and drive you home.  Make sure you have directions to the surgical center/hospital to ensure you arrive on time. 2. If you are having surgery at [facility] or [facility], you will need a copy of your medical history and physical form from your family physician within one month prior to the date of surgery. We will give you a form for your primary physician to complete.  3. We make every effort to accommodate the date you request for surgery.  However, there are times where surgery dates or times have to be moved.  We will contact you as soon as possible if a change in schedule is required.   4. No aspirin/ibuprofen for one week before surgery.  If you are on aspirin, any non-steroidal anti-inflammatory medications (Mobic, Aleve, Ibuprofen) should not be taken seven (7) days prior to your surgery.  You make take Tylenol for pain prior to surgery.  5. Medications - If you are taking daily heart and blood pressure medications, seizure, reflux, allergy, asthma, anxiety, pain or diabetes medications, make sure you notify the surgery center/hospital before the day of surgery so they can tell you which medications you should take or avoid the day of surgery. 6. No food or drink after midnight the night before surgery unless directed otherwise by surgical center/hospital staff. 7. No alcoholic beverages 24-hours prior to surgery.  No smoking 24-hours prior or 24-hours after  surgery. 8. Wear loose pants or shorts. They should be loose enough to fit over bandages, boots, and casts. 9. Don't wear slip-on shoes. Sneakers are preferred. 10. Bring your boot with you to the surgery center/hospital.  Also bring crutches or a walker if your physician has prescribed it for you.  If you do not have this equipment, it will be provided for you after surgery. 11. If you have not been contacted by the surgery center/hospital by the day before your surgery, call to confirm the date and time of your surgery. 12. Leave-time from work may vary depending on the type of surgery you have.  Appropriate arrangements should be made prior to surgery with your employer. 13. Prescriptions will be provided immediately following surgery by your doctor.  Fill these as soon as possible after surgery and take the medication as directed. Pain medications will not be refilled on weekends and must be approved by the doctor. 14. Remove nail polish on the operative foot and avoid getting pedicures prior to surgery. 15. Wash the night before surgery.  The night before surgery wash the foot and leg well with water and the antibacterial soap provided. Be sure to pay special attention to beneath the toenails and in between the toes.  Wash for at least three (3) minutes. Rinse thoroughly with water and dry well with a towel.  Perform this wash unless told not to do so by your physician.  Enclosed: 1 Ice pack (please put in freezer the night before surgery)   1 Hibiclens skin cleaner     Pre-op instructions  If you have any questions regarding the instructions, please do not hesitate to call our office.  [place]: [address], [place], [place] [address] -- [phone]  [place]: [address] [place], [place] [address] -- [phone]  [place]: [address]  [place], [place] [address] -- [phone]   Website: [email or web address] 

## 2018-09-16 ENCOUNTER — Telehealth: Payer: Self-pay | Admitting: *Deleted

## 2018-09-16 NOTE — Telephone Encounter (Signed)
"  I just saw Dr. Milinda Pointer regarding a surgery that I need to have.  I was interested in scheduling my outpatient surgery.  I know he said Fridays are the days he does those.  I'm wondering if there's any openings possibly on September 18 or 25.  When you get this message can you call me back."

## 2018-09-16 NOTE — Telephone Encounter (Signed)
"  I wanted to reach out to possibly scheduling my surgery.  When you get this message, call me back and let me know what dates are available.  I'd love to shoot for possibly September 25."

## 2018-09-17 ENCOUNTER — Encounter: Payer: Self-pay | Admitting: Podiatry

## 2018-09-17 NOTE — Progress Notes (Signed)
Subjective:  Patient ID: Morgan Lucas, female    DOB: 09/05/1975,  MRN: [id number] HPI Chief Complaint  Patient presents with  . Foot Pain    1st MPJ right - aching x several months, shoes sometimes uncomfy, redness, hurts to bend toe  . New Patient (Initial Visit)    43 y.o. female presents with the above complaint.   ROS: Denies fever chills nausea vomiting muscle aches pains calf pain back pain chest pain shortness of breath and headache.  Past Medical History:  Diagnosis Date  . Allergy   . Chronic bilateral low back pain without sciatica 08/30/2017  . Family history of bladder cancer   . Family history of colon cancer   . Family history of lung cancer   . Family history of ovarian cancer 08/30/2017  . Family history of prostate cancer   . Family history of uterine cancer   . 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 BUNIONECTOMY    . LAPAROSCOPIC APPENDECTOMY N/A 03/07/2018   Procedure: APPENDECTOMY LAPAROSCOPIC;  Surgeon: Sung AmabileSakai, Isami, DO;  Location: [facility] ORS;  Service: General;  Laterality: N/A;  . TONSILLECTOMY      Current Outpatient Medications:  .  aluminum chloride (DRYSOL) 20 % external solution, Apply 1 application topically once a week., Disp: , Rfl:  .  cetirizine (ZYRTEC) 10 MG tablet, Take 10 mg by mouth daily., Disp: , Rfl:  .  cyclobenzaprine (FLEXERIL) 5 MG tablet, Take 1-2 tablets (5-10 mg total) by mouth 2 (two) times daily as needed for muscle spasms., Disp: 30 tablet, Rfl: 0 .  EPINEPHrine 0.3 mg/0.3 mL IJ SOAJ injection, Inject 0.3 mLs into the muscle as needed., Disp: , Rfl:  .  etodolac (LODINE) 400 MG tablet, , Disp: , Rfl:  .  ibuprofen (ADVIL,MOTRIN) 800 MG tablet, Take 1 tablet (800 mg total) by mouth every 8 (eight) hours as needed for mild pain or moderate pain., Disp: 30 tablet, Rfl: 0 .  levonorgestrel-ethinyl estradiol (AVIANE,ALESSE,LESSINA) 0.1-20 MG-MCG tablet, Take 1 tablet by mouth daily., Disp: ,  Rfl:  .  Lysine 500 MG CAPS, Take by mouth., Disp: , Rfl:  .  metaxalone (SKELAXIN) 800 MG tablet, , Disp: , Rfl:  .  Multiple Vitamins-Calcium (ONE-A-DAY WOMENS PO), Take by mouth., Disp: , Rfl:  .  triamcinolone cream (KENALOG) 0.1 %, Apply 1 application topically 2 (two) times daily., Disp: , Rfl:   Allergies  Allergen Reactions  . Cashew Nut Oil Anaphylaxis    As per pt  . Codeine   . Hydrocodone-Acetaminophen Nausea Only  . Nickel   . Other     Other reaction(s): Other, see comments PN: CASHEWS: Anaphylaxis, PISTATCHIO: Itchy throat, Silver and nickel: Rash  . Pistachio Nut (Diagnostic)     Has Epi pen  . Silver    Review of Systems Objective:   Vitals:   09/15/18 0837  BP: (!) 106/52  Pulse: 85  Resp: 16    General: Well developed, nourished, in no acute distress, alert and oriented x3   Dermatological: Skin is warm, dry and supple bilateral. Nails x 10 are well maintained; remaining integument appears unremarkable at this time. There are no open sores, no preulcerative lesions, no rash or signs of infection present.  Vascular: Dorsalis Pedis artery and Posterior Tibial artery pedal pulses are 2/4 bilateral with immedate capillary fill time. Pedal hair growth present. No varicosities and no lower extremity edema present bilateral.   Neruologic: Grossly intact  via light touch bilateral. Vibratory intact via tuning fork bilateral. Protective threshold with Semmes Wienstein monofilament intact to all pedal sites bilateral. Patellar and Achilles deep tendon reflexes 2+ bilateral. No Babinski or clonus noted bilateral.   Musculoskeletal: No gross boney pedal deformities bilateral. No pain, crepitus, or limitation noted with foot and ankle range of motion bilateral. Muscular strength 5/5 in all groups tested bilateral.  Limited range of motion first metatarsal phalangeal joint of the right foot with osseous hypertrophy  Gait: Unassisted, Nonantalgic.    Radiographs:   Radiographs taken today demonstrate a flattening of the head of the first metatarsal and the base of the first proximal phalanx.  Dorsal spurring is noted on lateral view slight elevation of the first metatarsal all this consistent with osteoarthritis and hallux limitus.  Plantarflexed elongated second metatarsal is also noted.  Assessment & Plan:   Assessment: Hallux limitus plantarflexed elongated second metatarsal  Plan: We discussed etiology pathology conservative versus surgical therapies were discussed injection therapy fusion and silicone joint replacement.  At this point she would like to go ahead with a Keller arthroplasty and silicone implant right as well as a second metatarsal osteotomy right.  We discussed these in great detail today she understands and is amenable to it we did discuss the possible postop complications which may include but are not entirely limited to postop pain bleeding swelling infection possible need for further surgery loss of digit loss of limb loss of life overcorrection under correction.  Provided her with instructions regarding the morning of surgery also provided her with information regarding the surgery center and anesthesia group.  She understands this is amenable to it dispensed a Cam walker follow-up with her in the near future for surgical intervention.     Max T. [place], [place]

## 2018-09-22 NOTE — Telephone Encounter (Signed)
I attempted to call the patient back.  I left her a message that Dr. Milinda Pointer is not doing any appointments on September 30, 2018.  I informed her that we can schedule her for October 2, 9, or 30.  I asked her to give me a call back if she would like to schedule a date.

## 2018-10-26 ENCOUNTER — Encounter: Payer: Self-pay | Admitting: Family Medicine

## 2018-10-26 ENCOUNTER — Ambulatory Visit (INDEPENDENT_AMBULATORY_CARE_PROVIDER_SITE_OTHER): Payer: Managed Care, Other (non HMO) | Admitting: Family Medicine

## 2018-10-26 ENCOUNTER — Other Ambulatory Visit: Payer: Self-pay

## 2018-10-26 VITALS — Wt 187.0 lb

## 2018-10-26 DIAGNOSIS — J069 Acute upper respiratory infection, unspecified: Secondary | ICD-10-CM

## 2018-10-26 DIAGNOSIS — Z20822 Contact with and (suspected) exposure to covid-19: Secondary | ICD-10-CM

## 2018-10-26 MED ORDER — PROMETHAZINE-CODEINE 6.25-10 MG/5ML PO SYRP: 5.0000 mL | ORAL_SOLUTION | Freq: Four times a day (QID) | ORAL | 0 refills | Status: DC | PRN

## 2018-10-26 MED ORDER — BENZONATATE 100 MG PO CAPS: 100.0000 mg | ORAL_CAPSULE | Freq: Three times a day (TID) | ORAL | 0 refills | Status: DC | PRN

## 2018-10-26 NOTE — Progress Notes (Signed)
Chief Complaint  Patient presents with  . Cough    Morgan Lucas here for URI complaints. Due to COVID-19 pandemic, we are interacting via web portal for an electronic face-to-face visit. I verified patient's ID using 2 identifiers. Patient agreed to proceed with visit via this method. Patient is at home, I am at office. Patient and I are present for visit.   Duration: 16 days  Associated symptoms: sinus congestion, itchy watery eyes and cough Denies: sinus pain, rhinorrhea, itchy watery eyes, ear pain, ear drainage, sore throat, wheezing, shortness of breath, myalgia and fevers, digestive s/s Treatment to date: Delsym, honey Sick contacts: No  ROS:  Const: Denies fevers HEENT: As noted in HPI Lungs: No SOB  Past Medical History:  Diagnosis Date  . Allergy   . Chronic bilateral low back pain without sciatica 08/30/2017  . Family history of bladder cancer   . Family history of colon cancer   . Family history of lung cancer   . Family history of ovarian cancer 08/30/2017  . Family history of prostate cancer   . Family history of uterine cancer   . 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 Continue to push fluids, practice good hand hygiene, cover mouth when coughing. F/u prn. If starting to experience fevers, shaking, or shortness of breath, seek immediate care. Pt voiced understanding and agreement to the plan.  [place], DO 10/26/18 12:01 PM

## 2018-10-27 LAB — NOVEL CORONAVIRUS, NAA: SARS-CoV-2, NAA: NOT DETECTED

## 2018-11-22 ENCOUNTER — Encounter: Payer: Self-pay | Admitting: Podiatry

## 2018-11-25 ENCOUNTER — Telehealth: Payer: Self-pay | Admitting: Podiatry

## 2018-11-25 NOTE — Telephone Encounter (Signed)
DOS: 12/16/2018  SURGICAL PROCEDURES: Vilinda Blanks Implant [phone]) and Metatarsal Osteotomy 2nd [phone])  [employer] Effective Date: 01/05/2013 -   Deductible is $750 with $750 met and $0 remaining.  Out of Pocket is $2,250 with $2,250 met and $0 remaining.  CoInsurance is 80% / 20% but is now 100% as pt has met out of pocket maximum.  No prior authorization or referrals are required per Arneta Cliche.  Call reference # [id number].

## 2018-12-14 ENCOUNTER — Other Ambulatory Visit: Payer: Self-pay | Admitting: Podiatry

## 2018-12-14 MED ORDER — OXYCODONE-ACETAMINOPHEN 10-325 MG PO TABS: 1.0000 | ORAL_TABLET | Freq: Three times a day (TID) | ORAL | 0 refills | Status: AC | PRN

## 2018-12-14 MED ORDER — CEPHALEXIN 500 MG PO CAPS: 500.0000 mg | ORAL_CAPSULE | Freq: Three times a day (TID) | ORAL | 0 refills | Status: DC

## 2018-12-14 MED ORDER — ONDANSETRON HCL 4 MG PO TABS: 4.0000 mg | ORAL_TABLET | Freq: Three times a day (TID) | ORAL | 0 refills | Status: DC | PRN

## 2018-12-16 ENCOUNTER — Encounter: Payer: Self-pay | Admitting: Podiatry

## 2018-12-16 DIAGNOSIS — M2011 Hallux valgus (acquired), right foot: Secondary | ICD-10-CM | POA: Diagnosis not present

## 2018-12-16 DIAGNOSIS — M21541 Acquired clubfoot, right foot: Secondary | ICD-10-CM

## 2018-12-22 ENCOUNTER — Encounter: Payer: Self-pay | Admitting: Podiatry

## 2018-12-22 ENCOUNTER — Ambulatory Visit (INDEPENDENT_AMBULATORY_CARE_PROVIDER_SITE_OTHER): Payer: Managed Care, Other (non HMO) | Admitting: Podiatry

## 2018-12-22 ENCOUNTER — Ambulatory Visit (INDEPENDENT_AMBULATORY_CARE_PROVIDER_SITE_OTHER): Payer: Managed Care, Other (non HMO)

## 2018-12-22 ENCOUNTER — Other Ambulatory Visit: Payer: Self-pay

## 2018-12-22 VITALS — BP 120/79 | HR 68 | Resp 16

## 2018-12-22 DIAGNOSIS — Z9889 Other specified postprocedural states: Secondary | ICD-10-CM

## 2018-12-22 DIAGNOSIS — M205X1 Other deformities of toe(s) (acquired), right foot: Secondary | ICD-10-CM

## 2018-12-22 DIAGNOSIS — M21961 Unspecified acquired deformity of right lower leg: Secondary | ICD-10-CM

## 2018-12-25 NOTE — Progress Notes (Signed)
She presents today for her first postop visit date of surgery 12/16/2018 status post Jake Michaelis bunion implant a second metatarsal osteotomy states that is good is just a little sore.  She denies fever chills nausea vomiting muscle aches pains calf pain back pain chest pain shortness of breath.  Objective: Vital signs are stable alert and oriented x3 presents today ambulating cam walker.  Dressed her dressing intact was removed demonstrates some dried blood present mild edema no erythema cellulitis drainage or odor sutures are intact margins are well coapted she has great range of motion of the first metatarsophalangeal joint both passively and actively right foot.  Radiographs taken today demonstrate a Keller arthroplasty with a single silicone implant grommets are in place sec metatarsal osteotomy double screw fixation is in place.  Assessment: Well-healing surgical foot.  Plan: Discussed etiology pathology conservative versus surgical therapies at this point redressed the foot today placed her back in her cam walker she will continue to ambulate limiting her time not keeping the foot elevated is much as possible and increasing motion passively and actively to the toes.  I will follow-up with her in 1 week should she have questions or concerns she will notify us immediately.

## 2019-01-03 ENCOUNTER — Encounter: Payer: Self-pay | Admitting: Podiatry

## 2019-01-03 ENCOUNTER — Ambulatory Visit (INDEPENDENT_AMBULATORY_CARE_PROVIDER_SITE_OTHER): Payer: Managed Care, Other (non HMO) | Admitting: Podiatry

## 2019-01-03 ENCOUNTER — Other Ambulatory Visit: Payer: Self-pay

## 2019-01-03 DIAGNOSIS — M205X1 Other deformities of toe(s) (acquired), right foot: Secondary | ICD-10-CM

## 2019-01-03 DIAGNOSIS — M21961 Unspecified acquired deformity of right lower leg: Secondary | ICD-10-CM

## 2019-01-03 DIAGNOSIS — Z9889 Other specified postprocedural states: Secondary | ICD-10-CM

## 2019-01-03 NOTE — Progress Notes (Signed)
She presents today for postop visit #2 date of surgery 12/16/2018 status post Jake Michaelis bunion repair right foot.  And a second metatarsal osteotomy right foot.  States that is really feeling pretty good.  Objective: Vital signs are stable alert and oriented x3.  Pulses are palpable.  There is mild edema no erythema cellulitis drainage or odor sutures are intact margins well coapted she has good range of motion though the first metatarsophalangeal joint is somewhat stiff and the second is a little bit contracted she still has great range of motion of the joints.  The majority of the stiffness is most likely associated with scar tissue.  Assessment: Well-healing surgical foot.  Plan: Remove sutures today placed in a compression anklet encourage range of motion exercises and also placed her in a Darco shoe I will follow-up with her in 2 weeks for another set of x-rays.

## 2019-01-05 ENCOUNTER — Encounter: Payer: Self-pay | Admitting: Family Medicine

## 2019-01-17 ENCOUNTER — Encounter: Payer: Self-pay | Admitting: Podiatry

## 2019-01-17 ENCOUNTER — Other Ambulatory Visit: Payer: Self-pay

## 2019-01-17 ENCOUNTER — Ambulatory Visit (INDEPENDENT_AMBULATORY_CARE_PROVIDER_SITE_OTHER): Payer: Managed Care, Other (non HMO) | Admitting: Podiatry

## 2019-01-17 ENCOUNTER — Ambulatory Visit (INDEPENDENT_AMBULATORY_CARE_PROVIDER_SITE_OTHER): Payer: Managed Care, Other (non HMO)

## 2019-01-17 DIAGNOSIS — M21961 Unspecified acquired deformity of right lower leg: Secondary | ICD-10-CM

## 2019-01-17 DIAGNOSIS — M205X1 Other deformities of toe(s) (acquired), right foot: Secondary | ICD-10-CM

## 2019-01-17 DIAGNOSIS — Z9889 Other specified postprocedural states: Secondary | ICD-10-CM

## 2019-01-18 NOTE — Progress Notes (Signed)
She presents today for follow-up of her Keller implant sec metatarsal osteotomy.  Date of surgery is 12/16/2018 she states that they are really good just a little tight in the mornings.  She denies fever chills nausea vomiting muscle aches pains trauma.  Objective: Vital signs are stable alert and oriented x3 right foot mildly edematous with no erythema cellulitis drainage or odor.  Good range of motion at the first metatarsophalangeal joint and second metatarsophalangeal joint though they are tight most likely secondary to scar tissue in the area otherwise appear to be healing very nicely.  Radiographs confirm well-healing surgical foot from last visit.  Assessment: Well-healing surgical foot.  Plan: Encouraged her to continue physical therapy at home and massage therapy.  Encourage range of motion exercises.  Follow-up with her in 2 weeks

## 2019-01-31 ENCOUNTER — Telehealth: Payer: Self-pay | Admitting: *Deleted

## 2019-01-31 ENCOUNTER — Other Ambulatory Visit: Payer: Self-pay

## 2019-01-31 ENCOUNTER — Ambulatory Visit: Payer: Managed Care, Other (non HMO) | Admitting: Family Medicine

## 2019-01-31 ENCOUNTER — Ambulatory Visit (INDEPENDENT_AMBULATORY_CARE_PROVIDER_SITE_OTHER): Payer: Managed Care, Other (non HMO) | Admitting: Podiatry

## 2019-01-31 ENCOUNTER — Encounter: Payer: Self-pay | Admitting: Podiatry

## 2019-01-31 ENCOUNTER — Ambulatory Visit (INDEPENDENT_AMBULATORY_CARE_PROVIDER_SITE_OTHER): Payer: Managed Care, Other (non HMO)

## 2019-01-31 DIAGNOSIS — Z9889 Other specified postprocedural states: Secondary | ICD-10-CM

## 2019-01-31 DIAGNOSIS — M205X1 Other deformities of toe(s) (acquired), right foot: Secondary | ICD-10-CM

## 2019-01-31 DIAGNOSIS — M21961 Unspecified acquired deformity of right lower leg: Secondary | ICD-10-CM

## 2019-01-31 NOTE — Progress Notes (Signed)
She presents today for postop visit date of surgery December 16, 2018 status post Lorenz Coaster bunion implant status post second metatarsal osteotomy with double screw fixation states that she is doing quite well states that the swelling is coming down but just does not have the motion that she feels that she needs in the big toe.  States that she still concerned about putting her foot in a regular shoe so she wears sandals most of the time.  She denies calf pain back pain chest pain shortness of breath.  Objective: Vital signs are stable she is alert and oriented x3.  Pulses are palpable.  Much decrease and edema to the right foot she has good range of motion of the first metatarsophalangeal joint but somewhat stiff.  Great range of motion of the second metatarsophalangeal joint.  She still has considerable amount of scar tissue overlying the incision sites.  Assessment: Well-healing surgical foot limited range of motion secondary to scar tissue.  Plan: We will send her to physical therapy at [facility] for range of motion and soft tissue manipulation to help with scar tissue reduction.  I will follow-up with her in a couple of months.

## 2019-01-31 NOTE — Telephone Encounter (Signed)
-----   Message from Kristian Covey, [facility] sent at 01/31/2019  9:40 AM EST ----- Regarding: PT [facility] - in house  S/p keller implant DOS 12/11 right foot - increase ROM, decrease pain  Duration: 3 x week x 4 weeks  Eval and treat

## 2019-01-31 NOTE — Telephone Encounter (Signed)
Delivered orders to [facility]

## 2019-02-08 ENCOUNTER — Encounter: Payer: Self-pay | Admitting: Podiatry

## 2019-02-08 ENCOUNTER — Other Ambulatory Visit: Payer: Self-pay | Admitting: Podiatry

## 2019-02-08 DIAGNOSIS — Z9889 Other specified postprocedural states: Secondary | ICD-10-CM

## 2019-02-08 DIAGNOSIS — M205X1 Other deformities of toe(s) (acquired), right foot: Secondary | ICD-10-CM

## 2019-02-13 ENCOUNTER — Encounter: Payer: Self-pay | Admitting: Podiatry

## 2019-02-13 ENCOUNTER — Ambulatory Visit: Payer: Managed Care, Other (non HMO) | Admitting: Rehabilitative and Restorative Service Providers"

## 2019-03-10 ENCOUNTER — Other Ambulatory Visit: Payer: Self-pay

## 2019-03-13 ENCOUNTER — Ambulatory Visit (INDEPENDENT_AMBULATORY_CARE_PROVIDER_SITE_OTHER): Payer: Managed Care, Other (non HMO) | Admitting: Family Medicine

## 2019-03-13 ENCOUNTER — Encounter: Payer: Self-pay | Admitting: Family Medicine

## 2019-03-13 ENCOUNTER — Other Ambulatory Visit: Payer: Self-pay

## 2019-03-13 ENCOUNTER — Other Ambulatory Visit: Payer: Self-pay | Admitting: Family Medicine

## 2019-03-13 VITALS — BP 122/76 | HR 91 | Temp 97.5°F | Wt 190.0 lb

## 2019-03-13 DIAGNOSIS — L918 Other hypertrophic disorders of the skin: Secondary | ICD-10-CM

## 2019-03-13 NOTE — Patient Instructions (Signed)
Do not shower for the rest of the day. When you do wash it, use only soap and water. Do not vigorously scrub. Apply triple antibiotic ointment (like Neosporin) twice daily. Keep the area clean and dry.   Things to look out for: increasing pain not relieved by ibuprofen/acetaminophen, fevers, spreading redness, drainage of pus, or foul odor.  Give [place] a week to get the results of your skin results back.   Let us know if you need anything.

## 2019-03-13 NOTE — Addendum Note (Signed)
Addended by: Scharlene Gloss B on: 03/13/2019 01:53 PM   Modules accepted: Orders

## 2019-03-13 NOTE — Progress Notes (Signed)
Chief Complaint  Patient presents with  . Follow-up    Morgan Lucas is a 44 y.o. female here for a skin complaint.  Duration: 1 month Location: neck Pruritic? No Painful? Yes Drainage? No New soaps/lotions/topicals/detergents? No Sick contacts? No Other associated symptoms: bleeds/catches on clothing/jewelry Therapies tried thus far: none  ROS:  Const: No fevers Skin: As noted in HPI  Past Medical History:  Diagnosis Date  . Allergy   . Chronic bilateral low back pain without sciatica 08/30/2017  . Family history of bladder cancer   . Family history of colon cancer   . Family history of lung cancer   . Family history of ovarian cancer 08/30/2017  . Family history of prostate cancer   . Family history of uterine cancer   . 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 No drainage, erythema, TTP, fluctuance, excoriation Psych: Age appropriate judgment and insight  Procedure note; skin tag removal (total of 4) Informed consent obtained. The smaller skin tags were cleaned with alcohol and removed with a razor. Larger skin tags were anesthetized with 1% lidocaine with epinephrine after being cleaned with alcohol. The larger skin tag was clamped at the base. There were then removed with a razor. Hemostasis was obtained. There were no complications noted. The patient tolerated the procedure well.  Acrochordon - Plan: PR DESTRUCTION BENIGN LESIONS 15 OR MORE  Orders as above. Aftercare instructions verbalized and written down.  F/u prn. The patient voiced understanding and agreement to the plan.  Jilda Roche Marshall, DO 03/13/19 1:47 PM

## 2019-03-16 ENCOUNTER — Ambulatory Visit (INDEPENDENT_AMBULATORY_CARE_PROVIDER_SITE_OTHER): Payer: Managed Care, Other (non HMO)

## 2019-03-16 ENCOUNTER — Encounter: Payer: Self-pay | Admitting: Podiatry

## 2019-03-16 ENCOUNTER — Ambulatory Visit (INDEPENDENT_AMBULATORY_CARE_PROVIDER_SITE_OTHER): Payer: Managed Care, Other (non HMO) | Admitting: Podiatry

## 2019-03-16 ENCOUNTER — Other Ambulatory Visit: Payer: Self-pay

## 2019-03-16 DIAGNOSIS — Z9889 Other specified postprocedural states: Secondary | ICD-10-CM

## 2019-03-16 DIAGNOSIS — M205X1 Other deformities of toe(s) (acquired), right foot: Secondary | ICD-10-CM

## 2019-03-16 NOTE — Progress Notes (Signed)
She presents today she is now status post Keller bunion implant first metatarsophalangeal joint of the right foot she just started PT because there was a problem with insurance and the physical therapist has never seen a first metatarsophalangeal joint implant.  Objective: Vital signs are stable alert and oriented x3.  Pulses are palpable.  She has good plantar flexion of the first metatarsophalangeal joint of the right foot limited on dorsiflexion however more than likely secondary to scar tissue as her incision scar is fibrosed.  Assessment slowly resolving Keller arthroplasty with a single silicone implant.  Plan: I will follow-up with her in about 6 weeks she will be returning to physical therapy.

## 2019-04-27 ENCOUNTER — Ambulatory Visit (INDEPENDENT_AMBULATORY_CARE_PROVIDER_SITE_OTHER): Payer: Managed Care, Other (non HMO) | Admitting: Podiatry

## 2019-04-27 ENCOUNTER — Encounter: Payer: Self-pay | Admitting: Podiatry

## 2019-04-27 ENCOUNTER — Other Ambulatory Visit: Payer: Self-pay

## 2019-04-27 DIAGNOSIS — M205X1 Other deformities of toe(s) (acquired), right foot: Secondary | ICD-10-CM | POA: Diagnosis not present

## 2019-04-27 DIAGNOSIS — Z9889 Other specified postprocedural states: Secondary | ICD-10-CM

## 2019-04-27 NOTE — Progress Notes (Signed)
She presents today date of surgery 12/16/2018 status post Lorenz Coaster bunion implant right and a second metatarsal osteotomy this doing well.  She states that physical therapy is working well I think and I still have another week or so to go.  Sometimes it hurts with the change of the weather.  Objective: Vital signs are stable she is alert oriented x3.  Pulses are palpable.  Neurologic sensorium is intact.  Deep tendon reflexes are intact muscle strength is normal and symmetrical.  She has good range of motion of the first metatarsophalangeal joint of the right foot still limited slightly on dorsiflexion with some thickening of soft tissue most likely scar tissue around the first metatarsophalangeal joint but much better than what it was in the past.  Assessment: Well-healing Keller arthroplasty single silicone implant date of surgery 12/16/2018  Plan: At this point in letter release her and I would not suggest that she follow-up with physical therapy and follow-up with me if this is not better by December of this year.

## 2019-09-14 IMAGING — DX DG LUMBAR SPINE COMPLETE W/ BEND
7 series · 7 of 7 positions shown · non-contrast
Comparison: None.

CLINICAL DATA: Chronic low back pain

EXAM:
LUMBAR SPINE - COMPLETE WITH BENDING VIEWS

[l-spine ap]
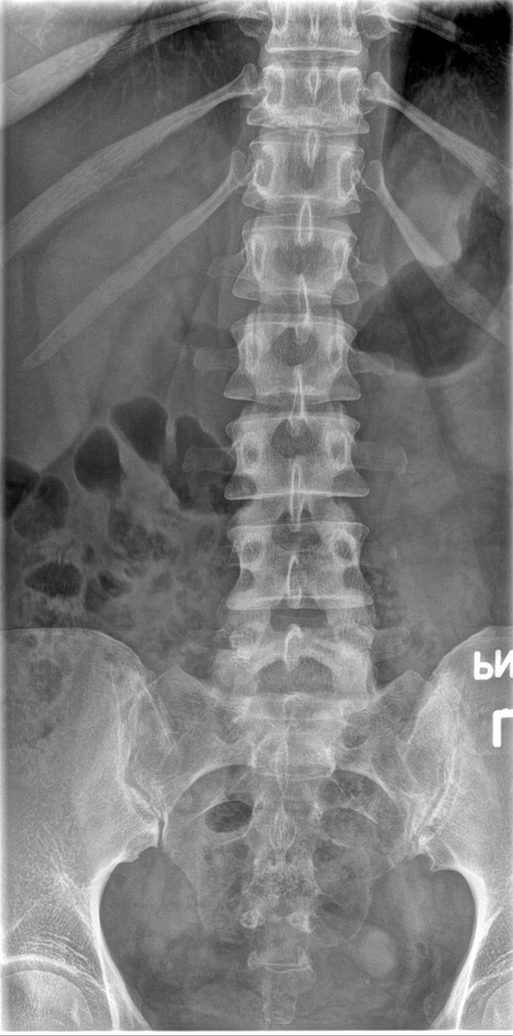

[l-spine obl (1 of 2)]
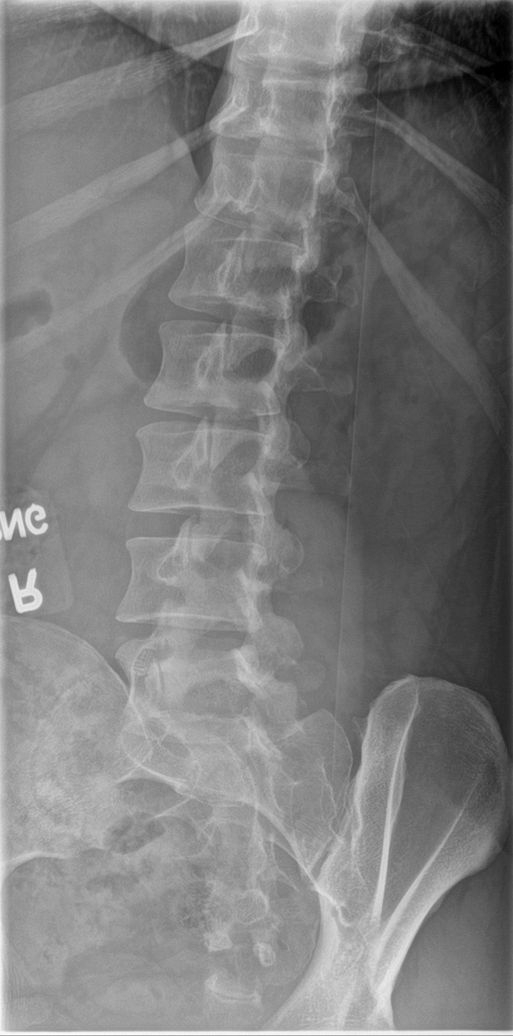

[l-spine obl (2 of 2)]
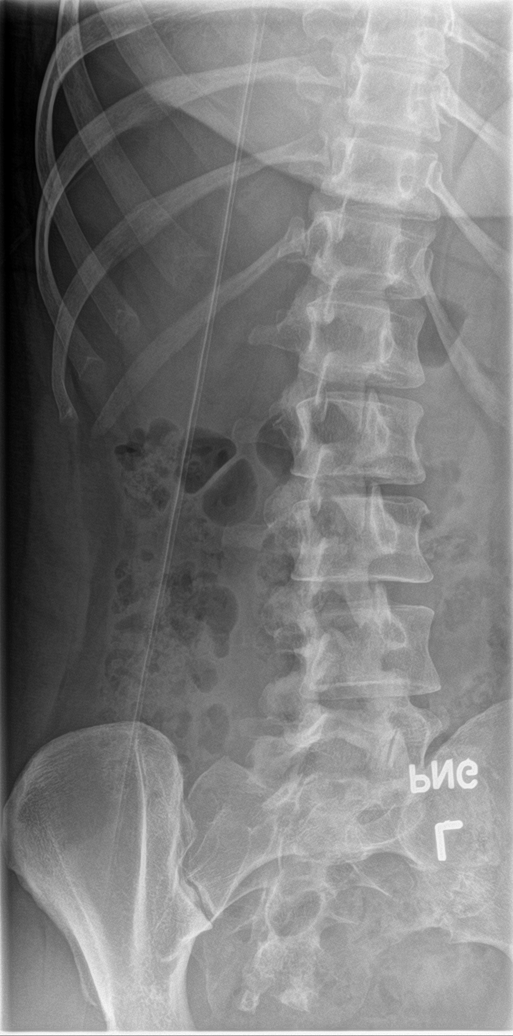

[l-spine lat]
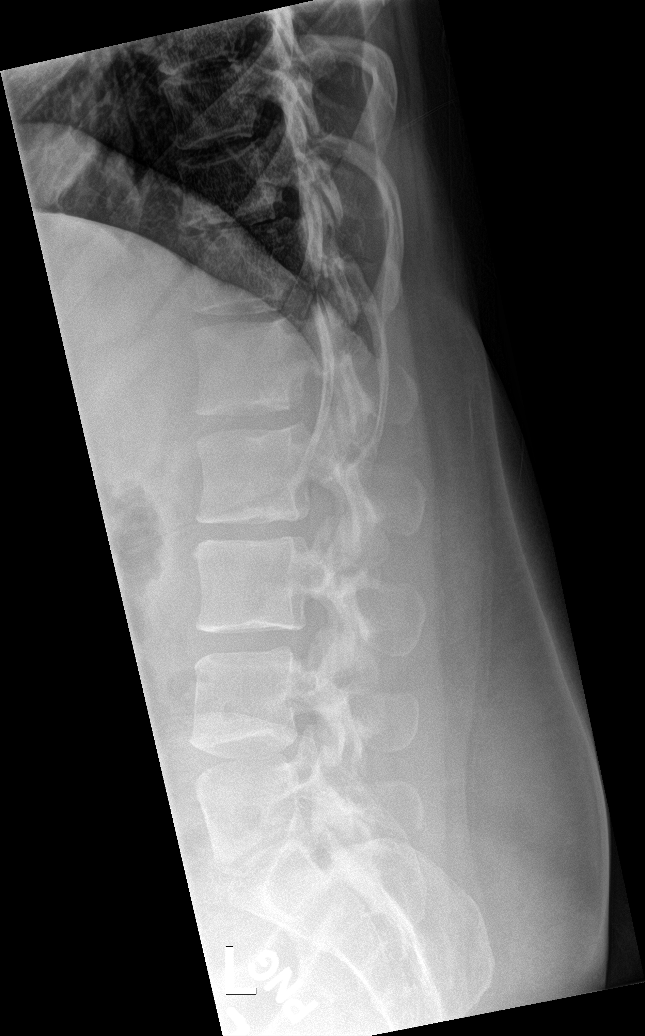

[l-spine spot]
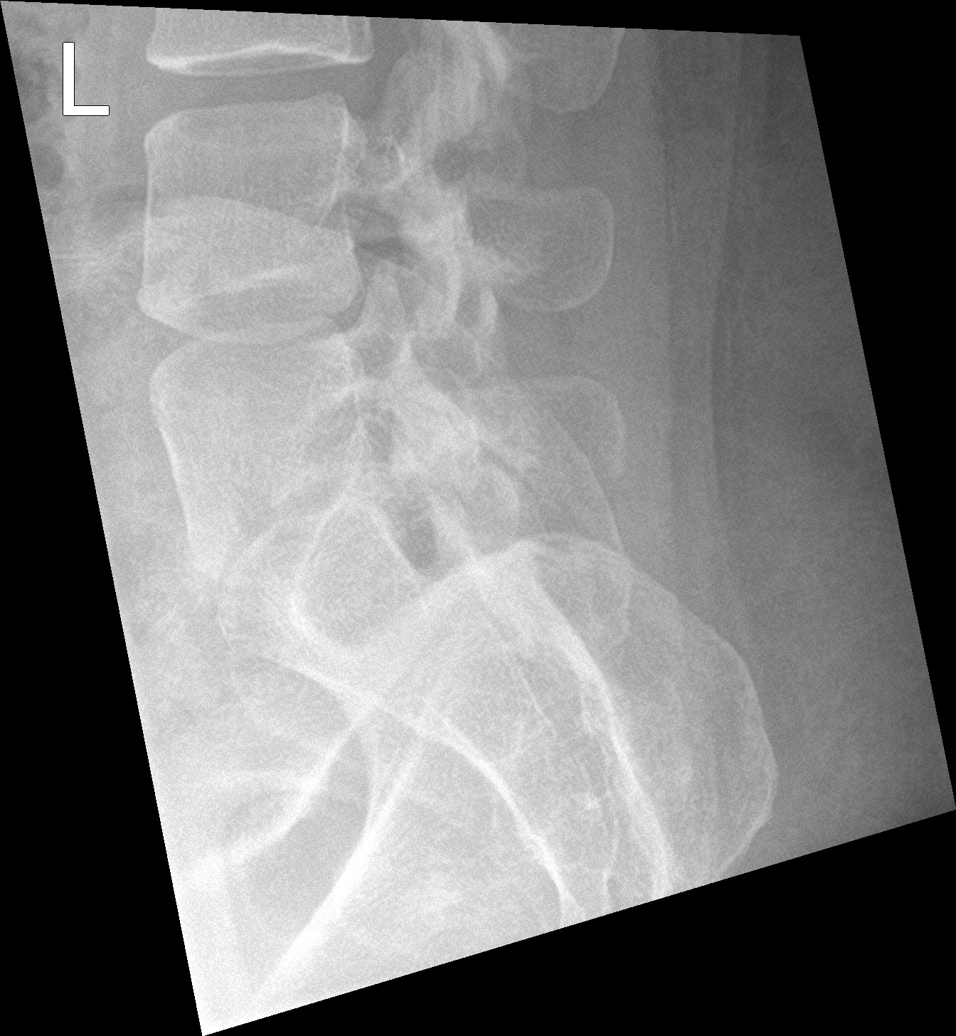

[l-spine ext]
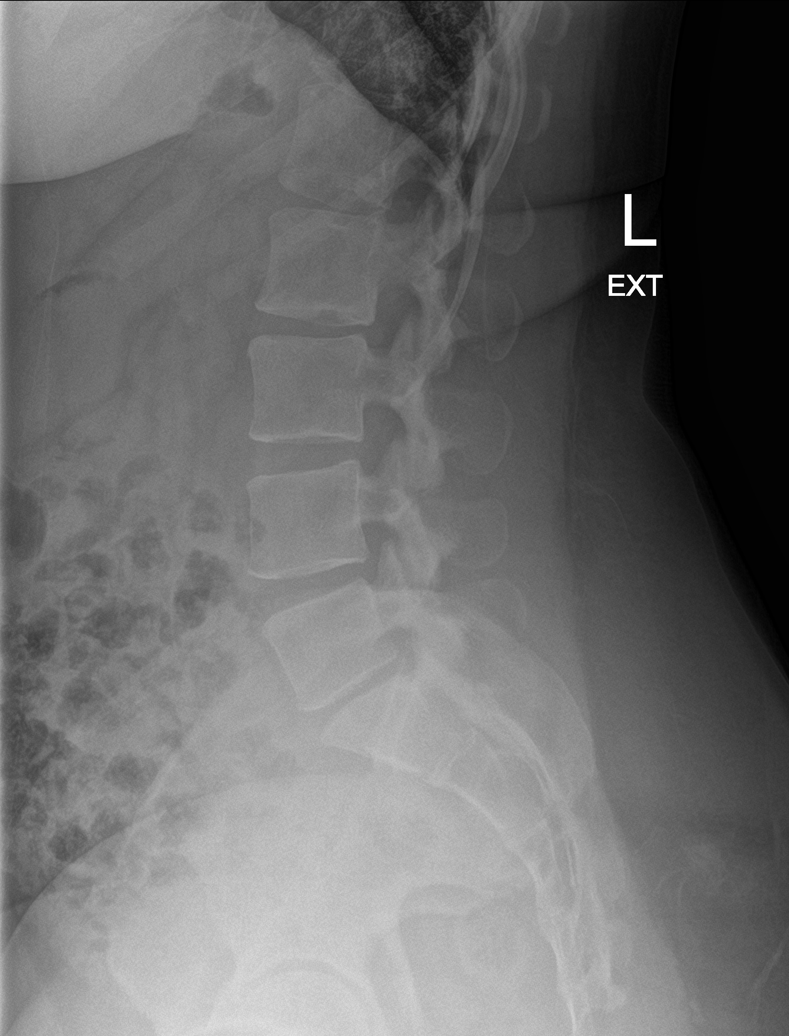

[l-spine flex]
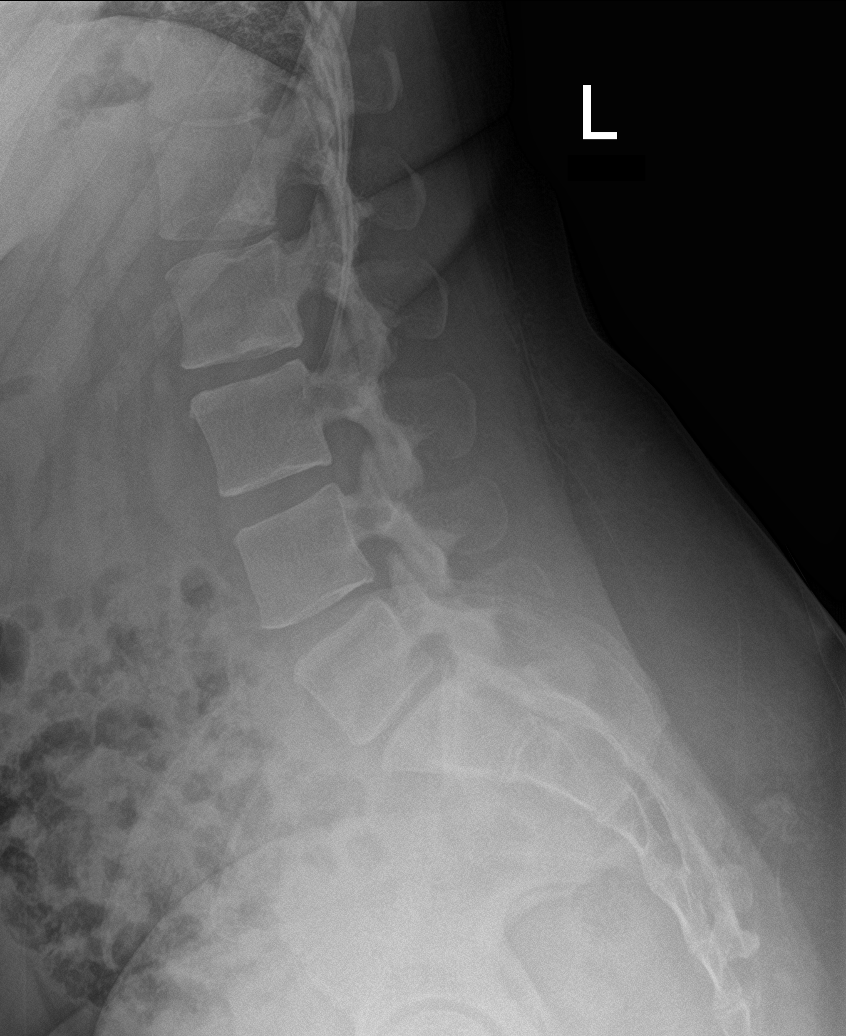

[7 of 7 positions shown; findings below may reference images not displayed]

FINDINGS: Slight scoliosis at L4-5.  Mild kyphosis in the upper lumbar spine

Normal alignment. No fracture or pars defect. Mild disc degeneration
and Schmorl's nodes L1-2, L2-3, L3-4.

Flexion-extension views reveal stable alignment.
IMPRESSION: Mild scoliosis and mild disc degeneration. No acute abnormality.
Stable alignment on flexion and extension.

## 2019-09-28 ENCOUNTER — Other Ambulatory Visit: Payer: Managed Care, Other (non HMO)

## 2019-09-28 DIAGNOSIS — Z20822 Contact with and (suspected) exposure to covid-19: Secondary | ICD-10-CM

## 2019-09-30 LAB — NOVEL CORONAVIRUS, NAA: SARS-CoV-2, NAA: NOT DETECTED

## 2019-09-30 LAB — SARS-COV-2, NAA 2 DAY TAT

## 2019-10-13 ENCOUNTER — Telehealth: Payer: Managed Care, Other (non HMO) | Admitting: Physician Assistant

## 2019-10-13 DIAGNOSIS — R059 Cough, unspecified: Secondary | ICD-10-CM | POA: Diagnosis not present

## 2019-10-13 MED ORDER — AZELASTINE HCL 0.1 % NA SOLN: 2.0000 | Freq: Two times a day (BID) | NASAL | 12 refills | Status: AC

## 2019-10-13 MED ORDER — BENZONATATE 100 MG PO CAPS: 100.0000 mg | ORAL_CAPSULE | Freq: Three times a day (TID) | ORAL | 0 refills | Status: DC

## 2019-10-13 NOTE — Progress Notes (Signed)
We are sorry you are not feeling well.  Here is how we plan to help!  Based on what you have shared with me, it looks like you may have a viral upper respiratory infection.  Upper respiratory infections are caused by a large number of viruses; however, rhinovirus is the most common cause.   Symptoms vary from person to person, with common symptoms including sore throat, cough, fatigue or lack of energy and feeling of general discomfort.  A low-grade fever of up to 100.4 may present, but is often uncommon.  Symptoms vary however, and are closely related to a person's age or underlying illnesses.  The most common symptoms associated with an upper respiratory infection are nasal discharge or congestion, cough, sneezing, headache and pressure in the ears and face.  These symptoms usually persist for about 3 to 10 days, but can last up to 2 weeks.  It is important to know that upper respiratory infections do not cause serious illness or complications in most cases.    Upper respiratory infections can be transmitted from person to person, with the most common method of transmission being a person's hands.  The virus is able to live on the skin and can infect other persons for up to 2 hours after direct contact.  Also, these can be transmitted when someone coughs or sneezes; thus, it is important to cover the mouth to reduce this risk.  To keep the spread of the illness at bay, good hand hygiene is very important.  This is an infection that is most likely caused by a virus. There are no specific treatments other than to help you with the symptoms until the infection runs its course.  We are sorry you are not feeling well.  Here is how we plan to help!   For nasal congestion, you may use an oral decongestants such as Mucinex D or if you have glaucoma or high blood pressure use plain Mucinex.  Saline nasal spray or nasal drops can help and can safely be used as often as needed for congestion.  For your congestion,  I have prescribed Azelastine nasal spray two sprays in each nostril twice a day  If you do not have a history of heart disease, hypertension, diabetes or thyroid disease, prostate/bladder issues or glaucoma, you may also use Sudafed to treat nasal congestion.  It is highly recommended that you consult with a pharmacist or your primary care physician to ensure this medication is safe for you to take.     If you have a cough, you may use cough suppressants such as Delsym and Robitussin.  If you have glaucoma or high blood pressure, you can also use Coricidin HBP.   For cough I have prescribed for you A prescription cough medication called Tessalon Perles 100 mg. You may take 1-2 capsules every 8 hours as needed for cough    If you have a sore or scratchy throat, use a saltwater gargle-  to  teaspoon of salt dissolved in a 4-ounce to 8-ounce glass of warm water.  Gargle the solution for approximately 15-30 seconds and then spit.  It is important not to swallow the solution.  You can also use throat lozenges/cough drops and Chloraseptic spray to help with throat pain or discomfort.  Warm or cold liquids can also be helpful in relieving throat pain.  For headache, pain or general discomfort, you can use Ibuprofen or Tylenol as directed.   Some authorities believe that zinc sprays or the  use of Echinacea may shorten the course of your symptoms.   HOME CARE . Only take medications as instructed by your medical team. . Be sure to drink plenty of fluids. Water is fine as well as fruit juices, sodas and electrolyte beverages. You may want to stay away from caffeine or alcohol. If you are nauseated, try taking small sips of liquids. How do you know if you are getting enough fluid? Your urine should be a pale yellow or almost colorless. . Get rest. . Taking a steamy shower or using a humidifier may help nasal congestion and ease sore throat pain. You can place a towel over your head and breathe in the steam  from hot water coming from a faucet. . Using a saline nasal spray works much the same way. . Cough drops, hard candies and sore throat lozenges may ease your cough. . Avoid close contacts especially the very young and the elderly . Cover your mouth if you cough or sneeze . Always remember to wash your hands.   GET HELP RIGHT AWAY IF: . You develop worsening fever. . If your symptoms do not improve within 10 days . You develop yellow or green discharge from your nose over 3 days. . You have coughing fits . You develop a severe head ache or visual changes. . You develop shortness of breath, difficulty breathing or start having chest pain . Your symptoms persist after you have completed your treatment plan  MAKE SURE YOU   Understand these instructions.  Will watch your condition.  Will get help right away if you are not doing well or get worse.  Your e-visit answers were reviewed by a board certified advanced clinical practitioner to complete your personal care plan. Depending upon the condition, your plan could have included both over the counter or prescription medications. Please review your pharmacy choice. If there is a problem, you may call our nursing hot line at and have the prescription routed to another pharmacy. Your safety is important to [place]. If you have drug allergies check your prescription carefully.   You can use MyChart to ask questions about today's visit, request a non-urgent call back, or ask for a work or school excuse for 24 hours related to this e-Visit. If it has been greater than 24 hours you will need to follow up with your provider, or enter a new e-Visit to address those concerns. You will get an e-mail in the next two days asking about your experience.  I hope that your e-visit has been valuable and will speed your recovery. Thank you for using e-visits.  Approximately 5 minutes was spent documenting and reviewing patient's chart.

## 2019-10-17 ENCOUNTER — Encounter: Payer: Self-pay | Admitting: Family Medicine

## 2019-10-17 ENCOUNTER — Telehealth (INDEPENDENT_AMBULATORY_CARE_PROVIDER_SITE_OTHER): Payer: Managed Care, Other (non HMO) | Admitting: Family Medicine

## 2019-10-17 ENCOUNTER — Other Ambulatory Visit: Payer: Self-pay

## 2019-10-17 DIAGNOSIS — J069 Acute upper respiratory infection, unspecified: Secondary | ICD-10-CM | POA: Diagnosis not present

## 2019-10-17 MED ORDER — PREDNISONE 20 MG PO TABS: 40.0000 mg | ORAL_TABLET | Freq: Every day | ORAL | 0 refills | Status: AC

## 2019-10-17 MED ORDER — PROMETHAZINE-CODEINE 6.25-10 MG/5ML PO SYRP: 5.0000 mL | ORAL_SOLUTION | Freq: Four times a day (QID) | ORAL | 0 refills | Status: AC | PRN

## 2019-10-17 NOTE — Progress Notes (Signed)
Chief Complaint  Patient presents with  . Cough  . Nasal Congestion    Morgan Lucas [facility] here for URI complaints. Due to COVID-19 pandemic, we are interacting via web portal for an electronic face-to-face visit. I verified patient's ID using 2 identifiers. Patient agreed to proceed with visit via this method. Patient is at work, I am at office. Patient and I are present for visit.   Duration: 10 days  Associated symptoms: sinus congestion, rhinorrhea and cough Denies: sinus pain, itchy watery eyes, ear pain, ear drainage, sore throat, wheezing, shortness of breath, myalgia and fevers Treatment to date: Delsym Sick contacts: Yes- someone sick at work; she tested neg for covid, is not vaccinated  Past Medical History:  Diagnosis Date  . Allergy   . Chronic bilateral low back pain without sciatica 08/30/2017  . Family history of bladder cancer   . Family history of colon cancer   . Family history of lung cancer   . Family history of ovarian cancer 08/30/2017  . Family history of prostate cancer   . Family history of uterine cancer   . 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 F/u prn. If starting to experience fevers, shaking, or shortness of breath, seek immediate care. Pt voiced understanding and agreement to the plan.  Morgan Roche Crane, DO 10/17/19 8:43 AM

## 2019-10-26 ENCOUNTER — Ambulatory Visit: Payer: Managed Care, Other (non HMO)

## 2019-10-27 ENCOUNTER — Ambulatory Visit: Payer: Managed Care, Other (non HMO) | Admitting: Family Medicine
